# Patient Record
Sex: Male | Born: 1975
Health system: Southern US, Community
[De-identification: ages and names within clinical notes are randomized; demographics above are authoritative.]

---

## 2004-06-16 ENCOUNTER — Emergency Department: Payer: Self-pay | Admitting: Emergency Medicine

## 2004-08-13 ENCOUNTER — Ambulatory Visit: Payer: Self-pay | Admitting: Family Medicine

## 2004-09-15 ENCOUNTER — Ambulatory Visit: Payer: Self-pay | Admitting: Internal Medicine

## 2004-09-24 ENCOUNTER — Ambulatory Visit: Payer: Self-pay | Admitting: Internal Medicine

## 2005-11-03 ENCOUNTER — Emergency Department: Payer: Self-pay | Admitting: Emergency Medicine

## 2005-11-03 DIAGNOSIS — K649 Unspecified hemorrhoids: Secondary | ICD-10-CM | POA: Insufficient documentation

## 2005-11-06 ENCOUNTER — Emergency Department (HOSPITAL_COMMUNITY): Admission: EM | Admit: 2005-11-06 | Discharge: 2005-11-06 | Payer: Self-pay | Admitting: Family Medicine

## 2005-12-09 ENCOUNTER — Ambulatory Visit: Payer: Self-pay | Admitting: Internal Medicine

## 2007-05-01 ENCOUNTER — Ambulatory Visit: Payer: Self-pay | Admitting: Family Medicine

## 2007-05-01 DIAGNOSIS — J020 Streptococcal pharyngitis: Secondary | ICD-10-CM | POA: Insufficient documentation

## 2007-05-01 LAB — CONVERTED CEMR LAB: Rapid Strep: POSITIVE

## 2007-12-28 ENCOUNTER — Ambulatory Visit: Payer: Self-pay | Admitting: Internal Medicine

## 2011-03-03 ENCOUNTER — Emergency Department: Payer: Self-pay | Admitting: Emergency Medicine

## 2013-06-26 ENCOUNTER — Encounter (HOSPITAL_COMMUNITY): Payer: Self-pay | Admitting: Emergency Medicine

## 2013-06-26 ENCOUNTER — Emergency Department (INDEPENDENT_AMBULATORY_CARE_PROVIDER_SITE_OTHER): Payer: 59

## 2013-06-26 ENCOUNTER — Emergency Department (HOSPITAL_COMMUNITY)
Admission: EM | Admit: 2013-06-26 | Discharge: 2013-06-26 | Disposition: A | Discharge status: Home / Self Care | Payer: 59 | Source: Home / Self Care | Attending: Family Medicine | Admitting: Family Medicine

## 2013-06-26 DIAGNOSIS — S8010XA Contusion of unspecified lower leg, initial encounter: Secondary | ICD-10-CM

## 2013-06-26 DIAGNOSIS — W19XXXA Unspecified fall, initial encounter: Secondary | ICD-10-CM

## 2013-06-26 DIAGNOSIS — S8012XA Contusion of left lower leg, initial encounter: Secondary | ICD-10-CM

## 2013-06-26 DIAGNOSIS — Y9379 Activity, other specified sports and athletics: Secondary | ICD-10-CM

## 2013-06-26 MED ORDER — TRAMADOL HCL 50 MG PO TABS: 50.0000 mg | ORAL_TABLET | Freq: Four times a day (QID) | ORAL | Status: DC | PRN

## 2013-06-26 NOTE — ED Notes (Signed)
Pt c/o left leg/knee inj onset last night Reports he was playing kickball and his leg/knee buckled underneath him Pain is throbbing and shoots down to foot Has been taking ibup and applying ice w/no relief Ambulated well to exam room w/slow gait Alert w/no signs of acute distress.

## 2013-06-26 NOTE — Discharge Instructions (Signed)
Thank you for coming in today. Use tramadol for pain as needed.  Come back if worsening.  If dramatically worse go to the ED.  Do not drive after taking tramadol.   Acute Compartment Syndrome Compartment syndrome is a painful condition that occurs when swelling and pressure build up in a body space (compartment) of the arms or legs. Groups of muscles, nerves, and blood vessels in the arms and legs are separated into various compartments. Each compartment is surrounded by tough layers of tissue called fascia. In compartment syndrome, pressure builds up within the layers of fascia and begins to push on the structures within that compartment.  In acute compartment syndrome, the pressure builds up suddenly, often as the result of an injury. This is a surgical emergency. When a muscle in the compartment moves, you may feel severe pain. If pressure continues to increase, it can block the flow of blood in the smallest blood vessels (capillaries). Then, the nerves and muscles in the compartment cannot get enough oxygen and nutrients (substances needed for survival). They will start to die within 4 8 hours. That is why the pressure needs to be relieved immediately. Identifying the condition early and treating it quickly can prevent most problems. CAUSES  Various things can lead to compartment syndrome. Possible causes include:   Injury. Some injuries can cause swelling or bleeding in a compartment. This can lead to compartment syndrome. Injuries that may cause this problem include:  Broken bones, especially the long bones of the arms and legs.  Crushing injuries.  Penetrating injuries, such as a knife wound that punctures the skin and tissue underneath.  Badly bruised muscles.  Poisonous bites, such as a snake bite.  Severe burns.  Blocked blood flow. This could result from:  A cast or bandage that is too tight.  A surgical procedure. Blood flow sometimes has to be stopped for a while during a  surgery, usually with a tourniquet.  Lying for too long in a position that restricts blood flow. This can happen in people who have nerve damage or if a person is unconscious for a long time.  Drugs used to build up muscles (anabolic steroids).  Drugs that keep the blood from forming clots (blood thinners). SIGNS AND SYMPTOMS  The most common symptom of compartment syndrome is pain. The pain may:   Get worse when moving or stretching the affected body part.  Be more severe than it should be for an injury.  Come along with a feeling of tingling or burning.  Become worse when the area is pushed or squeezed.  Be unaffected by pain medicine. Other symptoms include:   A feeling of tightness or fullness in the affected area.   A loss of feeling.  Weakness in the area.  Loss of movement.  Skin becoming pale, tight, and shiny over the painful area.  DIAGNOSIS  Your health care provider may suspect the problem based on how you describe the pain. The diagnosis is made by using a special device that measures the pressure in the affected area. Blood tests, X-rays, or an ultrasound exam may be done to help rule out other problems.  TREATMENT  Compartment syndrome is a surgical emergency. It should be treated very quickly.   First-aid treatment is given first. This may include:  Promptly treating an injury.  Loosening or removing any cast, bandage, or external wrap that may be causing pain.  Raising the painful arm or leg to the same level as the heart.  Giving oxygen.  Giving fluid through an IV access tube that is put into a vein in the hand or arm.  Surgery (fasciotomy) is needed to relieve the pressure and help prevent permanent damage. In this surgery, cuts (incisions) are made through the fascia to relieve the pressure in the compartment. Document Released: 12/29/2008 Document Revised: 09/12/2012 Document Reviewed: 08/14/2012 [facility] Patient Information 2014 [place],  [place].

## 2013-06-26 NOTE — ED Provider Notes (Signed)
Tony Robinson is a 38 y.o. male who presents to Urgent Care today for left leg pain. Patient was playing kickball yesterday evening when he ran and fell onto his left leg. He noted immediate pain in the anterior lateral shin.The pain is worse with ankle dorsiflexion. He denies any significant radiating pain weakness or numbness. The pain has worsened progressively over the last day. The pain is worse with activity and better with rest. He's tried ibuprofen which has been insufficient to control his pain but does help some.   History reviewed. No pertinent past medical history. History  Substance Use Topics  . Smoking status: Current Every Day Smoker -- 0.50 packs/day    Types: Cigarettes  . Smokeless tobacco: Not on file  . Alcohol Use: Yes   ROS as above Medications: No current facility-administered medications for this encounter.   Current Outpatient Prescriptions  Medication Sig Dispense Refill  . traMADol (ULTRAM) 50 MG tablet Take 1 tablet (50 mg total) by mouth every 6 (six) hours as needed.  15 tablet  0    Exam:  BP 136/83  Pulse 72  Temp(Src) 98.1 F (36.7 C) (Oral)  Resp 17  SpO2 100% Gen: Well NAD Left lower leg:   Hip: Nontender full motion Knee: No effusion nontender full motion stable ligamentous exam Lower leg: Normal-appearing no significant swelling ecchymosis. Tender palpation anterior lateral shin. Pain with resisted foot extension. Pulses capillary refill and sensation are intact distally. Antalgic gait.  Limited musculoskeletal ultrasound of the lateral leg: No bulging compartments present. Fascia is normal without bulging. The fascicular pattern is normal throughout. Intact blood flow present on color Doppler assessment. Low probability for compartment syndrome. Normal bony structures. Lateral meniscus is normal-appearing in the knee. The peroneal nerve at the posterior lateral part of the knee is also normal appearing  No results found for this or any  previous visit (from the past 24 hour(s)). Dg Tibia/fibula Left  06/26/2013   CLINICAL DATA:  Leg injury.  Pain.  EXAM: LEFT TIBIA AND FIBULA - 2 VIEW  COMPARISON:  None.  FINDINGS: There is no evidence of fracture or other focal bone lesions. Soft tissues are unremarkable. The knee and ankle joints are located.  IMPRESSION: Negative left tibia and fibula radiographs.   Electronically Signed   By: Gennette Pac M.D.   On: 06/26/2013 18:18    Assessment and Plan: 38 y.o. male with left lateral leg pain. Likely contusion. Given the degree of his pain there is concern for compartment syndrome however he is intact pulses and is able to move his foot normally. The ultrasound does not suggest compartment syndrome. However if his symptoms worsen he is to return to clinic or go to the emergency room for further evaluation. That time would recommend referral to orthopedics.  Discussed warning signs or symptoms. Please see discharge instructions. Patient expresses understanding.    Rodolph Bong, MD 06/26/13 [phone]

## 2013-06-28 ENCOUNTER — Encounter (HOSPITAL_COMMUNITY): Payer: Self-pay | Admitting: Emergency Medicine

## 2013-06-28 ENCOUNTER — Emergency Department (INDEPENDENT_AMBULATORY_CARE_PROVIDER_SITE_OTHER)
Admission: EM | Admit: 2013-06-28 | Discharge: 2013-06-28 | Disposition: A | Discharge status: Home / Self Care | Payer: 59 | Source: Home / Self Care | Attending: Emergency Medicine | Admitting: Emergency Medicine

## 2013-06-28 DIAGNOSIS — M79609 Pain in unspecified limb: Secondary | ICD-10-CM

## 2013-06-28 NOTE — Discharge Instructions (Signed)
Your persistent symptoms are suspicious for a popliteal ligament injury. We have placed you in a knee immobilizer to wear while weight bearing. You may remove at night. No strenuous activity until you have followed up with the orthopedists. Ibuprofen 600-800 mg three times a day will help with pain and inflammation. Please arrange follow up with Dr Magnus Ivan as soon as possible for further evaluation.

## 2013-06-28 NOTE — ED Provider Notes (Signed)
Medical screening examination/treatment/procedure(s) were performed by non-physician practitioner and as supervising physician I was immediately available for consultation/collaboration.  Raychelle Hudman, M.D.  Dafne Nield C Sherrin Stahle, MD 06/28/13 2219 

## 2013-06-28 NOTE — ED Notes (Signed)
Pt  Injured  His    l  Leg     3  Days  Ago  He  Was  Seen  By  Dr  Denyse Amass  Had  X  Rays  And   Sonogram      He  Was  Advised  To  Get  Rechecked  If any  Increase  Or  Change  Is  Symptoms       Pt  Reports  Now  Has  Has  Some   Pain and swelling  Behind the  l  Knee   He  Ambulated  To  Room

## 2013-06-28 NOTE — ED Provider Notes (Signed)
CSN: [id number]     Arrival date & time 06/28/13  1653 History   First MD Initiated Contact with Patient 06/28/13 1712     Chief Complaint  Patient presents with  . Follow-up   Patient is a 38 y.o. male presenting with knee pain. The history is provided by the patient.  Knee Pain Location:  Knee and leg Time since incident:  3 days Injury: yes   Mechanism of injury: fall   Fall:    Fall occurred:  Recreating/playing   Height of fall:  3-4 ft   Impact surface:  Grass   Entrapped after fall: no   Leg location:  L leg Knee location:  L knee Pain details:    Quality:  Burning and shooting   Severity:  Moderate   Onset quality:  Gradual   Duration:  24 hours   Timing:  Constant   Progression:  Worsening Chronicity:  New Dislocation: no   Foreign body present:  No foreign bodies Relieved by:  Rest Worsened by:  Bearing weight and activity Ineffective treatments:  Ice Associated symptoms: swelling   Associated symptoms: no fever, no muscle weakness, no numbness, no stiffness and no tingling   Risk factors: no concern for non-accidental trauma, no frequent fractures, no known bone disorder, no obesity and no recent illness   Pt seen here 06/26/13 s/p fall on 06/25/13 for (L) lateral leg pain. Xrays and bedside u/s at that time neg for fx or findings to suggest compartment syndrome. Since that time the (L) lateral leg pain has persisted and he has developed pain to his (L) posterior knee. He noted the area behind the (L) knee was swollen and it hurts worse w/ extended weight bearing though walking on does not cause significant increased pain. The pain is sometimes sharp and sometimes burning in nature and at times radiates down the (L) lateral LE.   History reviewed. No pertinent past medical history. History reviewed. No pertinent past surgical history. History reviewed. No pertinent family history. History  Substance Use Topics  . Smoking status: Current Every Day Smoker -- 0.50  packs/day    Types: Cigarettes  . Smokeless tobacco: Not on file  . Alcohol Use: Yes    Review of Systems  Constitutional: Negative for fever.  Musculoskeletal: Negative for stiffness.  All other systems reviewed and are negative.   Allergies  Review of patient's allergies indicates no known allergies.  Home Medications   Prior to Admission medications   Medication Sig Start Date End Date Taking? Authorizing Provider  traMADol (ULTRAM) 50 MG tablet Take 1 tablet (50 mg total) by mouth every 6 (six) hours as needed. 06/26/13   Rodolph Bong, MD   BP 113/84  Pulse 99  Temp(Src) 98.3 F (36.8 C) (Oral)  Resp 16  SpO2 99% Physical Exam  Constitutional: He is oriented to person, place, and time. He appears well-developed and well-nourished.  HENT:  Head: Normocephalic and atraumatic.  Eyes: Conjunctivae are normal.  Cardiovascular: Normal rate.   Pulmonary/Chest: Effort normal.  Musculoskeletal:  Swelling noted to the popliteal fossa of (L) leg. No contusion or ecchymosis. Significant pain w/ internal rotation of LLE w/ (L) knee flexed while pt prone.  Neurological: He is alert and oriented to person, place, and time.  Skin: Skin is warm and dry.  Psychiatric: He has a normal mood and affect.    ED Course  Procedures (including critical care time) Labs Review Labs Reviewed - No data to display  Imaging Review No results found.   MDM   1. Popliteal pain    Persistent (L) lateral leg pain w/ gradual worsening of posterior (L) knee pain. PE suspicious for popliteus ligament vs posterolateral corner injury. Pt placed in (L) knee immobilizer and referred to orthopedists. Encouraged TID Ibuprofen over next 3-5 days.  Pt agreeable w/ plan.    Leanne ChangKatherine P Schorr, NP 06/28/13 1904

## 2014-10-13 ENCOUNTER — Encounter (HOSPITAL_COMMUNITY): Payer: Self-pay | Admitting: Emergency Medicine

## 2014-10-13 ENCOUNTER — Emergency Department (INDEPENDENT_AMBULATORY_CARE_PROVIDER_SITE_OTHER)
Admission: EM | Admit: 2014-10-13 | Discharge: 2014-10-13 | Disposition: A | Discharge status: Home / Self Care | Payer: 59 | Source: Home / Self Care | Attending: Family Medicine | Admitting: Family Medicine

## 2014-10-13 DIAGNOSIS — M542 Cervicalgia: Secondary | ICD-10-CM | POA: Diagnosis not present

## 2014-10-13 DIAGNOSIS — M62838 Other muscle spasm: Secondary | ICD-10-CM

## 2014-10-13 DIAGNOSIS — M6248 Contracture of muscle, other site: Secondary | ICD-10-CM | POA: Diagnosis not present

## 2014-10-13 MED ORDER — METHOCARBAMOL 500 MG PO TABS
500.0000 mg | ORAL_TABLET | Freq: Four times a day (QID) | ORAL | Status: DC | PRN
Start: 2014-10-13 — End: 2016-02-22

## 2014-10-13 NOTE — ED Provider Notes (Signed)
CSN: [id number]     Arrival date & time 10/13/14  1717 History   First MD Initiated Contact with Patient 10/13/14 1820     Chief Complaint  Patient presents with  . Neck Pain   (Consider location/radiation/quality/duration/timing/severity/associated sxs/prior Treatment) HPI   Back pain, bilateral, started 2 weeks ago shortly after patient woke up in the morning. Radiation down to upper back area. Denies any numbness or tingling or weakness. Occasional headache and nausea. Symptoms are intermittent but getting worse. Ibuprofen 400 mg without relief. Denies any fevers, chest pain, short of breath, cough, rash. Denies history of trauma to the neck  History reviewed. No pertinent past medical history. History reviewed. No pertinent past surgical history. History reviewed. No pertinent family history. Social History  Substance Use Topics  . Smoking status: Current Every Day Smoker -- 0.50 packs/day    Types: Cigarettes  . Smokeless tobacco: None  . Alcohol Use: Yes    Review of Systems Per HPI with all other pertinent systems negative.   Allergies  Review of patient's allergies indicates no known allergies.  Home Medications   Prior to Admission medications   Medication Sig Start Date End Date Taking? Authorizing Provider  methocarbamol (ROBAXIN) 500 MG tablet Take 1-2 tablets (500-1,000 mg total) by mouth every 6 (six) hours as needed for muscle spasms. 10/13/14   Ozella Rocks, MD  traMADol (ULTRAM) 50 MG tablet Take 1 tablet (50 mg total) by mouth every 6 (six) hours as needed. 06/26/13   Rodolph Bong, MD   Meds Ordered and Administered this Visit  Medications - No data to display  BP 138/94 mmHg  Pulse 69  Temp(Src) 98 F (36.7 C) (Oral)  Resp 18  SpO2 98% No data found.   Physical Exam Physical Exam  Constitutional: oriented to person, place, and time. appears well-developed and well-nourished. No distress.  HENT:  Head: Normocephalic and atraumatic.  Eyes: EOMI.  PERRL.  Neck: Normal range of motion.  Cardiovascular: RRR, no m/r/g, 2+ distal pulses,  Pulmonary/Chest: Effort normal and breath sounds normal. No respiratory distress.  Abdominal: Soft. Bowel sounds are normal. NonTTP, no distension.  Musculoskeletal: Neck with full range of motion and freely movable, mild. Vital cervical muscle tenderness and bilateral trapezius muscle tightness to palpation.  Neurological: alert and oriented to person, place, and time.  Skin: Skin is warm. No rash noted. non diaphoretic.  Psychiatric: normal mood and affect. behavior is normal. Judgment and thought content normal.   ED Course  Procedures (including critical care time)  Labs Review Labs Reviewed - No data to display  Imaging Review No results found.   Visual Acuity Review  Right Eye Distance:   Left Eye Distance:   Bilateral Distance:    Right Eye Near:   Left Eye Near:    Bilateral Near:         MDM   1. Neck pain   2. Trapezius muscle spasm    Increase ibuprofen to 600 800 mg every 6-8 hours, start Robaxin, describe specific stretching exercises, heat, and massage the patient is to start more regularly to treat symptoms. Patient follow-up with orthopedic surgeon [facility] if not improving.    Ozella Rocks, MD 10/13/14 [phone]

## 2014-10-13 NOTE — Discharge Instructions (Signed)
Your neck pain is coming from muscle strain and spasm of the spinal neck muscles and the trapezius muscle. Please use the muscle relaxer as described in your visit. Please increase your dose of ibuprofen to 600 mg to 800 mg every 6-8 hours. Please report to apply heat, massage, and stretch the area multiple times per day. These call either the [facility] on [address] one of the local orthopedic offices for a follow-up appointment for when you come back for your business trip. If your symptoms have resolved by then E appointment otherwise follow-up in their office for further management.

## 2014-10-13 NOTE — ED Notes (Signed)
C/o neck pain States upper back to neck radiating to right shoulder Heat, cool and stretching used as tx

## 2015-04-23 ENCOUNTER — Emergency Department (HOSPITAL_COMMUNITY): Payer: 59

## 2015-04-23 ENCOUNTER — Observation Stay (HOSPITAL_COMMUNITY)
Admission: EM | Admit: 2015-04-23 | Discharge: 2015-04-25 | Disposition: A | Discharge status: Home / Self Care | Payer: 59 | Attending: Internal Medicine | Admitting: Internal Medicine

## 2015-04-23 ENCOUNTER — Encounter (HOSPITAL_COMMUNITY): Payer: Self-pay | Admitting: *Deleted

## 2015-04-23 DIAGNOSIS — K529 Noninfective gastroenteritis and colitis, unspecified: Principal | ICD-10-CM | POA: Insufficient documentation

## 2015-04-23 DIAGNOSIS — F17210 Nicotine dependence, cigarettes, uncomplicated: Secondary | ICD-10-CM | POA: Diagnosis not present

## 2015-04-23 LAB — URINALYSIS, ROUTINE W REFLEX MICROSCOPIC
Bilirubin Urine: NEGATIVE
GLUCOSE, UA: NEGATIVE mg/dL
HGB URINE DIPSTICK: NEGATIVE
Ketones, ur: NEGATIVE mg/dL
LEUKOCYTES UA: NEGATIVE
Nitrite: NEGATIVE
PH: 7 (ref 5.0–8.0)
Protein, ur: NEGATIVE mg/dL
Specific Gravity, Urine: 1.018 (ref 1.005–1.030)

## 2015-04-23 LAB — CBC WITH DIFFERENTIAL/PLATELET
BASOS ABS: 0 10*3/uL (ref 0.0–0.1)
Basophils Relative: 0 %
EOS PCT: 0 %
Eosinophils Absolute: 0 10*3/uL (ref 0.0–0.7)
HEMATOCRIT: 44.7 % (ref 39.0–52.0)
Hemoglobin: 15.2 g/dL (ref 13.0–17.0)
LYMPHS ABS: 1.5 10*3/uL (ref 0.7–4.0)
LYMPHS PCT: 17 %
MCH: 30.6 pg (ref 26.0–34.0)
MCHC: 34 g/dL (ref 30.0–36.0)
MCV: 89.9 fL (ref 78.0–100.0)
MONO ABS: 0.9 10*3/uL (ref 0.1–1.0)
Monocytes Relative: 11 %
NEUTROS ABS: 6 10*3/uL (ref 1.7–7.7)
Neutrophils Relative %: 72 %
PLATELETS: 176 10*3/uL (ref 150–400)
RBC: 4.97 MIL/uL (ref 4.22–5.81)
RDW: 13.2 % (ref 11.5–15.5)
WBC: 8.4 10*3/uL (ref 4.0–10.5)

## 2015-04-23 LAB — COMPREHENSIVE METABOLIC PANEL
ALK PHOS: 57 U/L (ref 38–126)
ALT: 26 U/L (ref 17–63)
AST: 24 U/L (ref 15–41)
Albumin: 4 g/dL (ref 3.5–5.0)
Anion gap: 12 (ref 5–15)
BILIRUBIN TOTAL: 0.5 mg/dL (ref 0.3–1.2)
BUN: 9 mg/dL (ref 6–20)
CHLORIDE: 101 mmol/L (ref 101–111)
CO2: 22 mmol/L (ref 22–32)
Calcium: 9.5 mg/dL (ref 8.9–10.3)
Creatinine, Ser: 1.17 mg/dL (ref 0.61–1.24)
GLUCOSE: 153 mg/dL — AB (ref 65–99)
POTASSIUM: 3.8 mmol/L (ref 3.5–5.1)
Sodium: 135 mmol/L (ref 135–145)
TOTAL PROTEIN: 6.8 g/dL (ref 6.5–8.1)

## 2015-04-23 LAB — LIPASE, BLOOD: Lipase: 24 U/L (ref 11–51)

## 2015-04-23 LAB — I-STAT TROPONIN, ED: Troponin i, poc: 0 ng/mL (ref 0.00–0.08)

## 2015-04-23 LAB — I-STAT CG4 LACTIC ACID, ED: LACTIC ACID, VENOUS: 0.85 mmol/L (ref 0.5–2.0)

## 2015-04-23 MED ORDER — IOHEXOL 350 MG/ML SOLN
100.0000 mL | Freq: Once | INTRAVENOUS | Status: AC | PRN
  Administered 2015-04-23: 100 mL via INTRAVENOUS

## 2015-04-23 MED ORDER — OXYCODONE-ACETAMINOPHEN 5-325 MG PO TABS
ORAL_TABLET | ORAL | Status: AC
  Filled 2015-04-23: qty 1

## 2015-04-23 MED ORDER — MORPHINE SULFATE (PF) 4 MG/ML IV SOLN
4.0000 mg | Freq: Once | INTRAVENOUS | Status: AC
  Administered 2015-04-23: 4 mg via INTRAVENOUS
  Filled 2015-04-23: qty 1

## 2015-04-23 MED ORDER — SODIUM CHLORIDE 0.9 % IV BOLUS (SEPSIS)
1000.0000 mL | Freq: Once | INTRAVENOUS | Status: AC
  Administered 2015-04-23: 1000 mL via INTRAVENOUS

## 2015-04-23 MED ORDER — OXYCODONE-ACETAMINOPHEN 5-325 MG PO TABS
1.0000 | ORAL_TABLET | ORAL | Status: DC | PRN
  Administered 2015-04-23: 1 via ORAL

## 2015-04-23 MED ORDER — GI COCKTAIL ~~LOC~~: 30.0000 mL | Freq: Once | ORAL | Status: DC

## 2015-04-23 NOTE — ED Notes (Signed)
Pt here with epigastric pain that feels sharp and is stabbing.  Pt denies any n/v with this.  Pt appears in obvious discomfort.

## 2015-04-23 NOTE — ED Provider Notes (Signed)
CSN: [id number]     Arrival date & time 04/23/15  1927 History   First MD Initiated Contact with Patient 04/23/15 2044     No chief complaint on file.     HPI  40 y.o. previously healthy male presenting with epigastric abdominal pain that began this morning and has been getting more severe throughout the day. Pain is mild at baseline, but intermittently becomes very severe in a spasm-like fashion. When it is most severe it is 10 out of 10 and causes the patient to double over and be unable to speak. He has not eaten throughout the day today secondary to the pain. He denies nausea and vomiting. States that he is hungry but afraid to eat. Has been having normal bowel movements with no constipation, diarrhea, blood in his stools. No frequency or dysuria. No hematuria. Denies back pain. He drinks 2 alcoholic beverages a night. No history of kidney stones. Patient began taking Tamiflu yesterday for temperature of 99.9 and generalized body aches. Denies sore throat, cough, chest pain.   History reviewed. No pertinent past medical history. History reviewed. No pertinent past surgical history. No family history on file. Social History  Substance Use Topics  . Smoking status: Current Every Day Smoker -- 0.50 packs/day    Types: Cigarettes  . Smokeless tobacco: None  . Alcohol Use: Yes    Review of Systems  Constitutional: Negative for fever, chills, activity change and appetite change.  HENT: Negative for congestion, facial swelling, rhinorrhea and sore throat.   Eyes: Negative for visual disturbance.  Respiratory: Negative for cough, shortness of breath and wheezing.   Cardiovascular: Negative for chest pain, palpitations and leg swelling.  Gastrointestinal: Positive for abdominal pain. Negative for nausea, vomiting, diarrhea, constipation, blood in stool and abdominal distention.  Genitourinary: Negative for dysuria, frequency, hematuria, flank pain and difficulty urinating.  Musculoskeletal:  Negative for myalgias, back pain, joint swelling, arthralgias, neck pain and neck stiffness.  Skin: Negative for rash.  Neurological: Negative for dizziness, weakness, light-headedness and headaches.  Psychiatric/Behavioral: Negative for behavioral problems, confusion and agitation.      Allergies  Review of patient's allergies indicates no known allergies.  Home Medications   Prior to Admission medications   Medication Sig Start Date End Date Taking? Authorizing Provider  methocarbamol (ROBAXIN) 500 MG tablet Take 1-2 tablets (500-1,000 mg total) by mouth every 6 (six) hours as needed for muscle spasms. 10/13/14   Ozella Rocksavid J Merrell, MD  traMADol (ULTRAM) 50 MG tablet Take 1 tablet (50 mg total) by mouth every 6 (six) hours as needed. 06/26/13   Rodolph BongEvan S Corey, MD   BP 106/76 mmHg  Pulse 65  Temp(Src) 98.3 F (36.8 C) (Oral)  Resp 20  Ht 6\' 2"  (1.88 m)  Wt 79.379 kg  BMI 22.46 kg/m2  SpO2 96% Physical Exam  Constitutional: He is oriented to person, place, and time. He appears well-developed and well-nourished. No distress.  HENT:  Head: Normocephalic and atraumatic.  Right Ear: External ear normal.  Left Ear: External ear normal.  Nose: Nose normal.  Mouth/Throat: Oropharynx is clear and moist. No oropharyngeal exudate.  Eyes: Conjunctivae are normal. Pupils are equal, round, and reactive to light. Right eye exhibits no discharge. Left eye exhibits no discharge. No scleral icterus.  Neck: Normal range of motion. Neck supple. No tracheal deviation present.  Cardiovascular: Normal rate, regular rhythm, normal heart sounds and intact distal pulses.  Exam reveals no gallop and no friction rub.   No murmur  heard. Pulmonary/Chest: Effort normal and breath sounds normal. No respiratory distress. He has no wheezes. He has no rales.  Abdominal: Soft. Bowel sounds are normal. He exhibits no distension and no mass. There is tenderness (epigastric). There is no rebound and no guarding.   Musculoskeletal: Normal range of motion. He exhibits no edema or tenderness.  Neurological: He is alert and oriented to person, place, and time. He exhibits normal muscle tone.  Skin: Skin is warm and dry. No rash noted. He is not diaphoretic.  Psychiatric: He has a normal mood and affect. His behavior is normal. Judgment and thought content normal.    ED Course  Procedures (including critical care time) Labs Review Labs Reviewed  COMPREHENSIVE METABOLIC PANEL - Abnormal; Notable for the following:    Glucose, Bld 153 (*)    All other components within normal limits  I-STAT CG4 LACTIC ACID, ED - Abnormal; Notable for the following:    Lactic Acid, Venous 0.34 (*)    All other components within normal limits  CBC WITH DIFFERENTIAL/PLATELET  URINALYSIS, ROUTINE W REFLEX MICROSCOPIC (NOT AT [facility])  LIPASE, BLOOD  I-STAT TROPOININ, ED  I-STAT CG4 LACTIC ACID, ED    Imaging Review Dg Abd 2 Views  04/23/2015  CLINICAL DATA:  Severe abdominal pain beginning today. EXAM: ABDOMEN - 2 VIEW COMPARISON:  None. FINDINGS: The bowel gas pattern is normal. There is no evidence of free air. No radio-opaque calculi or other significant radiographic abnormality is seen. IMPRESSION: Negative. Electronically Signed   By: Amie Portland M.D.   On: 04/23/2015 20:57   Ct Cta Abd/pel W/cm &/or W/o Cm  04/23/2015  CLINICAL DATA:  Acute onset of generalized abdominal pain. Initial encounter. EXAM: CTA ABDOMEN AND PELVIS wITHOUT AND WITH CONTRAST TECHNIQUE: Multidetector CT imaging of the abdomen and pelvis was performed using the standard protocol during bolus administration of intravenous contrast. Multiplanar reconstructed images and MIPs were obtained and reviewed to evaluate the vascular anatomy. CONTRAST:  OMNIPAQUE IOHEXOL 350 MG/ML SOLN COMPARISON:  Abdominal radiograph performed earlier today at 8:34 p.m. FINDINGS: The visualized lung bases are clear. The liver and spleen are unremarkable in  appearance. The gallbladder is within normal limits. The pancreas and adrenal glands are unremarkable. The kidneys are unremarkable in appearance. There is no evidence of hydronephrosis. No renal or ureteral stones are seen. No perinephric stranding is appreciated. There is diffuse wall thickening and soft tissue inflammation involving a very long segment of distal ileum, extending to the level of the cecum, with a small amount of associated free fluid. There is no evidence for mesenteric ischemia. The visualized vasculature appears intact. This may reflect infectious or inflammatory ileitis. Crohn's disease cannot be excluded, though the appearance is somewhat less typical for Crohn's disease. The more proximal small bowel is unremarkable. The stomach is within normal limits. No acute vascular abnormalities are seen. The abdominal aorta is fully patent. The celiac trunk, superior mesenteric artery, bilateral renal arteries and inferior mesenteric artery are fully patent. No calcific atherosclerotic disease is seen. The appendix is not well characterized; there is no evidence of appendicitis. Fluid and stool are noted within the colon. The colon is grossly unremarkable in appearance. The bladder is mildly distended and grossly unremarkable. The prostate remains normal in size. No inguinal lymphadenopathy is seen. No acute osseous abnormalities are identified. Review of the MIP images confirms the above findings. IMPRESSION: 1. Diffuse wall thickening and soft tissue inflammation involving a very long segment of distal ileum, extending  to the level of the cecum, with a small amount of associated free fluid. No evidence for mesenteric ischemia. This may reflect infectious or inflammatory ileitis. Crohn's disease cannot be excluded, though the appearance is somewhat less typical for Crohn's disease. 2. Visualized vasculature is fully patent and unremarkable in appearance. No evidence of calcific atherosclerotic  disease. Electronically Signed   By: Roanna Raider M.D.   On: 04/23/2015 22:43   I have personally reviewed and evaluated these images and lab results as part of my medical decision-making.   EKG Interpretation None      MDM   Final diagnoses:  Ileitis   Patient intermittently appears to be in severe pain. He is afebrile with stable vital signs. Abdomen is moderately tender to palpation in the epigastrium. No leukocytosis and labs are unremarkable. Urinalysis is not indicative of UTI. Lipase is not elevated and I doubt pancreatitis. No lactic acidosis. Do not suspect sepsis. CT abdomen performed that reveals diffuse wall thickening of a very long segment of the distal ileum extending to the cecum. No evidence of mesenteric ischemia. Will give Cipro and Flagyl for empiric treatment of likely infectious ileitis. Given patient's continued severe pain, will admit to the hospital for pain relief and observation. If symptoms do not improve with antibiotics patient will require further workup for inflammatory bowel disease. He denies history of same. He stable for admission.     Jenifer Ernestina Penna, MD 04/24/15 8119  Margarita Grizzle, MD 04/27/15 1721

## 2015-04-23 NOTE — ED Notes (Signed)
Pt is having sever spasms where pain is 10/10

## 2015-04-23 NOTE — ED Notes (Signed)
Adjusted acuity due to pt severe pain

## 2015-04-24 ENCOUNTER — Encounter (HOSPITAL_COMMUNITY): Payer: Self-pay | Admitting: Internal Medicine

## 2015-04-24 DIAGNOSIS — K529 Noninfective gastroenteritis and colitis, unspecified: Secondary | ICD-10-CM | POA: Diagnosis not present

## 2015-04-24 LAB — COMPREHENSIVE METABOLIC PANEL
ALBUMIN: 3.1 g/dL — AB (ref 3.5–5.0)
ALT: 20 U/L (ref 17–63)
AST: 16 U/L (ref 15–41)
Alkaline Phosphatase: 45 U/L (ref 38–126)
Anion gap: 7 (ref 5–15)
BUN: 7 mg/dL (ref 6–20)
CHLORIDE: 107 mmol/L (ref 101–111)
CO2: 25 mmol/L (ref 22–32)
CREATININE: 0.96 mg/dL (ref 0.61–1.24)
Calcium: 8.5 mg/dL — ABNORMAL LOW (ref 8.9–10.3)
GFR calc Af Amer: >60 mL/min (ref >60)
GFR calc non Af Amer: >60 mL/min (ref >60)
GLUCOSE: 94 mg/dL (ref 65–99)
POTASSIUM: 3.7 mmol/L (ref 3.5–5.1)
SODIUM: 139 mmol/L (ref 135–145)
Total Bilirubin: 0.6 mg/dL (ref 0.3–1.2)
Total Protein: 5.4 g/dL — ABNORMAL LOW (ref 6.5–8.1)

## 2015-04-24 LAB — CBC
HCT: 39.7 % (ref 39.0–52.0)
Hemoglobin: 13.5 g/dL (ref 13.0–17.0)
MCH: 30.9 pg (ref 26.0–34.0)
MCHC: 34 g/dL (ref 30.0–36.0)
MCV: 90.8 fL (ref 78.0–100.0)
PLATELETS: 155 10*3/uL (ref 150–400)
RBC: 4.37 MIL/uL (ref 4.22–5.81)
RDW: 13.5 % (ref 11.5–15.5)
WBC: 6 10*3/uL (ref 4.0–10.5)

## 2015-04-24 LAB — GLUCOSE, CAPILLARY
GLUCOSE-CAPILLARY: 86 mg/dL (ref 65–99)
GLUCOSE-CAPILLARY: 128 mg/dL — AB (ref 65–99)
Glucose-Capillary: 98 mg/dL (ref 65–99)
Glucose-Capillary: 90 mg/dL (ref 65–99)
Glucose-Capillary: 97 mg/dL (ref 65–99)

## 2015-04-24 LAB — I-STAT CG4 LACTIC ACID, ED: Lactic Acid, Venous: 0.34 mmol/L — ABNORMAL LOW (ref 0.5–2.0)

## 2015-04-24 MED ORDER — PANTOPRAZOLE SODIUM 40 MG PO TBEC
40.0000 mg | DELAYED_RELEASE_TABLET | Freq: Every day | ORAL | Status: DC
  Administered 2015-04-24: 40 mg via ORAL
  Filled 2015-04-24: qty 1

## 2015-04-24 MED ORDER — CIPROFLOXACIN IN D5W 400 MG/200ML IV SOLN
400.0000 mg | Freq: Two times a day (BID) | INTRAVENOUS | Status: DC
  Administered 2015-04-24 – 2015-04-25 (×3): 400 mg via INTRAVENOUS
  Filled 2015-04-24 (×4): qty 200

## 2015-04-24 MED ORDER — FOLIC ACID 1 MG PO TABS
1.0000 mg | ORAL_TABLET | Freq: Every day | ORAL | Status: DC
  Administered 2015-04-24 – 2015-04-25 (×2): 1 mg via ORAL
  Filled 2015-04-24 (×2): qty 1

## 2015-04-24 MED ORDER — ONDANSETRON HCL 4 MG/2ML IJ SOLN: 4.0000 mg | Freq: Four times a day (QID) | INTRAMUSCULAR | Status: DC | PRN

## 2015-04-24 MED ORDER — CIPROFLOXACIN IN D5W 400 MG/200ML IV SOLN: 400.0000 mg | Freq: Once | INTRAVENOUS | Status: DC

## 2015-04-24 MED ORDER — METRONIDAZOLE IN NACL 5-0.79 MG/ML-% IV SOLN
500.0000 mg | Freq: Once | INTRAVENOUS | Status: AC
  Administered 2015-04-24: 500 mg via INTRAVENOUS
  Filled 2015-04-24: qty 100

## 2015-04-24 MED ORDER — VITAMIN B-1 100 MG PO TABS
100.0000 mg | ORAL_TABLET | Freq: Every day | ORAL | Status: DC
  Administered 2015-04-24 – 2015-04-25 (×2): 100 mg via ORAL
  Filled 2015-04-24 (×2): qty 1

## 2015-04-24 MED ORDER — INFLUENZA VAC SPLIT QUAD 0.5 ML IM SUSY: 0.5000 mL | PREFILLED_SYRINGE | INTRAMUSCULAR | Status: DC | PRN

## 2015-04-24 MED ORDER — LORAZEPAM 1 MG PO TABS: 1.0000 mg | ORAL_TABLET | Freq: Four times a day (QID) | ORAL | Status: DC | PRN

## 2015-04-24 MED ORDER — ACETAMINOPHEN 325 MG PO TABS
650.0000 mg | ORAL_TABLET | Freq: Four times a day (QID) | ORAL | Status: DC | PRN
  Filled 2015-04-24: qty 2

## 2015-04-24 MED ORDER — LORAZEPAM 2 MG/ML IJ SOLN: 1.0000 mg | Freq: Four times a day (QID) | INTRAMUSCULAR | Status: DC | PRN

## 2015-04-24 MED ORDER — SODIUM CHLORIDE 0.9 % IV BOLUS (SEPSIS)
1000.0000 mL | Freq: Once | INTRAVENOUS | Status: AC
  Administered 2015-04-24: 1000 mL via INTRAVENOUS

## 2015-04-24 MED ORDER — PNEUMOCOCCAL VAC POLYVALENT 25 MCG/0.5ML IJ INJ
0.5000 mL | INJECTION | INTRAMUSCULAR | Status: DC | PRN
Start: 2015-04-24 — End: 2015-04-25

## 2015-04-24 MED ORDER — SODIUM CHLORIDE 0.9 % IV SOLN
INTRAVENOUS | Status: AC
  Administered 2015-04-24: 22:00:00 via INTRAVENOUS

## 2015-04-24 MED ORDER — PANTOPRAZOLE SODIUM 40 MG PO TBEC
40.0000 mg | DELAYED_RELEASE_TABLET | Freq: Two times a day (BID) | ORAL | Status: DC
  Administered 2015-04-24 – 2015-04-25 (×2): 40 mg via ORAL
  Filled 2015-04-24 (×2): qty 1

## 2015-04-24 MED ORDER — SODIUM CHLORIDE 0.9 % IV SOLN: INTRAVENOUS | Status: DC

## 2015-04-24 MED ORDER — ADULT MULTIVITAMIN W/MINERALS CH
1.0000 | ORAL_TABLET | Freq: Every day | ORAL | Status: DC
  Administered 2015-04-24 – 2015-04-25 (×2): 1 via ORAL
  Filled 2015-04-24 (×2): qty 1

## 2015-04-24 MED ORDER — OXYCODONE HCL 5 MG PO TABS
5.0000 mg | ORAL_TABLET | Freq: Four times a day (QID) | ORAL | Status: DC | PRN
  Administered 2015-04-24 – 2015-04-25 (×2): 5 mg via ORAL
  Filled 2015-04-24 (×2): qty 1

## 2015-04-24 MED ORDER — ONDANSETRON HCL 4 MG PO TABS: 4.0000 mg | ORAL_TABLET | Freq: Four times a day (QID) | ORAL | Status: DC | PRN

## 2015-04-24 MED ORDER — ACETAMINOPHEN 650 MG RE SUPP: 650.0000 mg | Freq: Four times a day (QID) | RECTAL | Status: DC | PRN

## 2015-04-24 MED ORDER — GI COCKTAIL ~~LOC~~
30.0000 mL | Freq: Once | ORAL | Status: AC
  Administered 2015-04-24: 30 mL via ORAL
  Filled 2015-04-24: qty 30

## 2015-04-24 MED ORDER — MORPHINE SULFATE (PF) 2 MG/ML IV SOLN
1.0000 mg | INTRAVENOUS | Status: DC | PRN
  Administered 2015-04-24: 1 mg via INTRAVENOUS
  Filled 2015-04-24: qty 1

## 2015-04-24 MED ORDER — THIAMINE HCL 100 MG/ML IJ SOLN: 100.0000 mg | Freq: Every day | INTRAMUSCULAR | Status: DC

## 2015-04-24 MED ORDER — METRONIDAZOLE IN NACL 5-0.79 MG/ML-% IV SOLN
500.0000 mg | Freq: Three times a day (TID) | INTRAVENOUS | Status: DC
  Administered 2015-04-24 – 2015-04-25 (×5): 500 mg via INTRAVENOUS
  Filled 2015-04-24 (×8): qty 100

## 2015-04-24 NOTE — Consult Note (Signed)
Referring Provider: Dr. Vanessa Barbara Primary Care Physician:  No primary care provider on file. Primary Gastroenterologist:  Gentry Fitz  Reason for Consultation:  Abdominal pain  HPI: Tony Robinson is a 40 y.o. male with no past medical history had the onset of abdominal pain starting last Wednesday that was preceded by chills and body aches. He then began having epigastric pain that would occur every 3-5 minutes and last for 10-30 seconds. Pain was "stabbing" and was 9-10/10 in intensity when he came into the ER yesterday. Pain is continuing to come on but much less intense and is dull lasting 10-15 seconds. Pain does not radiate. Denies associated N/V/D. Denies melena or hematochezia. Denies weight loss. Moving around the room does make today's recurrent abdominal pain worse. Denies dysphagia, heartburn. Denies any change in his bowel habits. Moves bowels at home 1-2 times per day. Occasional alcohol. Rare NSAIDs about once every 2 weeks. CT scan showed a long segment of inflammation and wall thickening in the distal ileum. Tolerating full liquids. Wife at bedside.  History reviewed. No pertinent past medical history.  History reviewed. No pertinent past surgical history.  Prior to Admission medications   Medication Sig Start Date End Date Taking? Authorizing Provider  methocarbamol (ROBAXIN) 500 MG tablet Take 1-2 tablets (500-1,000 mg total) by mouth every 6 (six) hours as needed for muscle spasms. 10/13/14   Ozella Rocks, MD  traMADol (ULTRAM) 50 MG tablet Take 1 tablet (50 mg total) by mouth every 6 (six) hours as needed. 06/26/13   Rodolph Bong, MD    Scheduled Meds: . ciprofloxacin  400 mg Intravenous Q12H  . folic acid  1 mg Oral Daily  . metronidazole  500 mg Intravenous Q8H  . multivitamin with minerals  1 tablet Oral Daily  . pantoprazole  40 mg Oral BID  . thiamine  100 mg Oral Daily   Continuous Infusions: . sodium chloride     PRN Meds:.acetaminophen **OR** acetaminophen,  Influenza vac split quadrivalent PF, LORazepam **OR** LORazepam, ondansetron **OR** ondansetron (ZOFRAN) IV, pneumococcal 23 valent vaccine  Allergies as of 04/23/2015  . (No Known Allergies)    Family History  Problem Relation Age of Onset  . Diabetes Mellitus II Mother     Social History   Social History  . Marital Status: Married    Spouse Name: N/A  . Number of Children: N/A  . Years of Education: N/A   Occupational History  . Not on file.   Social History Main Topics  . Smoking status: Current Every Day Smoker -- 0.50 packs/day    Types: Cigarettes  . Smokeless tobacco: Not on file  . Alcohol Use: Yes  . Drug Use: No  . Sexual Activity: Not on file   Other Topics Concern  . Not on file   Social History Narrative    Review of Systems: All negative except as stated above in HPI.  Physical Exam: Vital signs: Filed Vitals:   04/24/15 0131 04/24/15 0552  BP: 104/65 106/65  Pulse: 61 72  Temp: 98.4 F (36.9 C) 98.5 F (36.9 C)  Resp: 18 18   Last BM Date: 04/23/15 General:   Alert,  Thin, pleasant and cooperative in NAD Head: atraumatic Eyes: anicteric sclera ENT: oropharynx clear Neck: supple, nontender Lungs:  Clear throughout to auscultation.   No wheezes, crackles, or rhonchi. No acute distress. Heart:  Regular rate and rhythm; no murmurs, clicks, rubs,  or gallops. Abdomen: diffusely tender with guarding (greatest in epigastric and  RLQ), soft, nondistended, +BS  Rectal:  Deferred Ext: no edema Skin: no rash, no jaundice  GI:  Lab Results:  Recent Labs  04/23/15 2041 04/24/15 0531  WBC 8.4 6.0  HGB 15.2 13.5  HCT 44.7 39.7  PLT 176 155   BMET  Recent Labs  04/23/15 2041 04/24/15 0531  NA 135 139  K 3.8 3.7  CL 101 107  CO2 22 25  GLUCOSE 153* 94  BUN 9 7  CREATININE 1.17 0.96  CALCIUM 9.5 8.5*   LFT  Recent Labs  04/24/15 0531  PROT 5.4*  ALBUMIN 3.1*  AST 16  ALT 20  ALKPHOS 45  BILITOT 0.6   PT/INR No results  for input(s): LABPROT, INR in the last 72 hours.   Studies/Results: Dg Abd 2 Views  04/23/2015  CLINICAL DATA:  Severe abdominal pain beginning today. EXAM: ABDOMEN - 2 VIEW COMPARISON:  None. FINDINGS: The bowel gas pattern is normal. There is no evidence of free air. No radio-opaque calculi or other significant radiographic abnormality is seen. IMPRESSION: Negative. Electronically Signed   By: Amie Portlandavid  Ormond M.D.   On: 04/23/2015 20:57   Ct Cta Abd/pel W/cm &/or W/o Cm  04/23/2015  CLINICAL DATA:  Acute onset of generalized abdominal pain. Initial encounter. EXAM: CTA ABDOMEN AND PELVIS wITHOUT AND WITH CONTRAST TECHNIQUE: Multidetector CT imaging of the abdomen and pelvis was performed using the standard protocol during bolus administration of intravenous contrast. Multiplanar reconstructed images and MIPs were obtained and reviewed to evaluate the vascular anatomy. CONTRAST:  100mL OMNIPAQUE IOHEXOL 350 MG/ML SOLN COMPARISON:  Abdominal radiograph performed earlier today at 8:34 p.m. FINDINGS: The visualized lung bases are clear. The liver and spleen are unremarkable in appearance. The gallbladder is within normal limits. The pancreas and adrenal glands are unremarkable. The kidneys are unremarkable in appearance. There is no evidence of hydronephrosis. No renal or ureteral stones are seen. No perinephric stranding is appreciated. There is diffuse wall thickening and soft tissue inflammation involving a very long segment of distal ileum, extending to the level of the cecum, with a small amount of associated free fluid. There is no evidence for mesenteric ischemia. The visualized vasculature appears intact. This may reflect infectious or inflammatory ileitis. Crohn's disease cannot be excluded, though the appearance is somewhat less typical for Crohn's disease. The more proximal small bowel is unremarkable. The stomach is within normal limits. No acute vascular abnormalities are seen. The abdominal aorta  is fully patent. The celiac trunk, superior mesenteric artery, bilateral renal arteries and inferior mesenteric artery are fully patent. No calcific atherosclerotic disease is seen. The appendix is not well characterized; there is no evidence of appendicitis. Fluid and stool are noted within the colon. The colon is grossly unremarkable in appearance. The bladder is mildly distended and grossly unremarkable. The prostate remains normal in size. No inguinal lymphadenopathy is seen. No acute osseous abnormalities are identified. Review of the MIP images confirms the above findings. IMPRESSION: 1. Diffuse wall thickening and soft tissue inflammation involving a very long segment of distal ileum, extending to the level of the cecum, with a small amount of associated free fluid. No evidence for mesenteric ischemia. This may reflect infectious or inflammatory ileitis. Crohn's disease cannot be excluded, though the appearance is somewhat less typical for Crohn's disease. 2. Visualized vasculature is fully patent and unremarkable in appearance. No evidence of calcific atherosclerotic disease. Electronically Signed   By: Roanna RaiderJeffery  Chang M.D.   On: 04/23/2015 22:43  Impression/Plan: 40 yo with new onset of epigastric pain without associated N/V/D and CT findings showing a long inflamed segment of the distal ileum. I think his symptoms are due to gastroenteritis (bacterial vs viral). Doubt Crohn's disease. Doubt peptic ulcer disease. His pain is epigastric, which is not a typical location based on the area of inflammation on the CT scan but definitely could be related to the small bowel inflammation since the involved segment is long. Question dyspepsia as a source of his epigastric pain as well but would treat symptomatically and continue IV Abx until tomorrow and then transition to oral Abx if tolerating solid food. Hold off on colon/EGD at this time unless pain persisting. No obstructive symptoms. Reassurance given.  Advance diet to low fat diet. If feels ok and tolerating diet can go home tomorrow and f/u as an outpt. If not tolerating diet can consider adding Sucralfate slurry. Dr. Dulce Sellar will f/u tomorrow if necessary. Call us back if needed.    LOS: 0 days   Kieli Golladay C.  04/24/2015, 4:02 PM  Pager [phone]  If no answer or after 5 PM call [phone]

## 2015-04-24 NOTE — H&P (Signed)
[facility] History and Physical  Tony FretJames B Hickling ZOX:[id number] DOB: 08/20/1975 DOA: 04/23/2015  Referring physician: Dr.Irick. PCP: No primary care provider on file.  Specialists: None.  Chief Complaint: Abdominal pain.  HPI: Tony Robinson is a 40 y.o. male with no significant past medical history presents to the ER because of sudden onset abdominal pain since yesterday afternoon. Patient's pain is mostly in epigastric area. Denies any nausea vomiting or diarrhea denies any recent travel. Patient states his son has had some nausea vomiting with GI about 2 days ago. Patient denies any recent use of antibiotics. Drinks about 2-3 beers every night. CT of the abdomen and pelvis done shows inflammation involving a long segment of ileum and possible cecum. On exam patient does have some tenderness in suprapubic and right lower quadrant. Patient has been admitted for further management.   Review of Systems: As presented in the history of presenting illness, rest negative.  History reviewed. No pertinent past medical history. History reviewed. No pertinent past surgical history. Social History:  reports that he has been smoking Cigarettes.  He has been smoking about 0.50 packs per day. He does not have any smokeless tobacco history on file. He reports that he drinks alcohol. He reports that he does not use illicit drugs. Where does patient live home. Can patient participate in ADLs? Yes.  No Known Allergies  Family History:  Family History  Problem Relation Age of Onset  . Diabetes Mellitus II Mother       Prior to Admission medications   Medication Sig Start Date End Date Taking? Authorizing Provider  methocarbamol (ROBAXIN) 500 MG tablet Take 1-2 tablets (500-1,000 mg total) by mouth every 6 (six) hours as needed for muscle spasms. 10/13/14   Ozella Rocksavid J Merrell, MD  traMADol (ULTRAM) 50 MG tablet Take 1 tablet (50 mg total) by mouth every 6 (six) hours as needed. 06/26/13   Rodolph BongEvan S Corey,  MD    Physical Exam: Filed Vitals:   04/23/15 2330 04/24/15 0000 04/24/15 0100 04/24/15 0131  BP: 106/76 117/76 108/72 104/65  Pulse: 65 70 67 61  Temp:    98.4 F (36.9 C)  TempSrc:    Oral  Resp:    18  Height:    6' 2.5" (1.892 m)  Weight:    179 lb 8 oz (81.421 kg)  SpO2: 96% 99% 97% 99%     General:  Moderately built and nourished.  Eyes: Anicteric no pallor.  ENT: No discharge from the ears eyes nose and mouth.  Neck: No mass felt.  Cardiovascular: S1 and S2 heard.  Respiratory: No rhonchi or crepitations.  Abdomen: Mild tenderness in the suprapubic and right lower quadrant.  Skin: No rash.  Musculoskeletal: No edema.  Psychiatric: Appears normal.  Neurologic: Alert awake oriented to time place and person. Moves all extremities.  Labs on Admission:  Basic Metabolic Panel:  Recent Labs Lab 04/23/15 2041  NA 135  K 3.8  CL 101  CO2 22  GLUCOSE 153*  BUN 9  CREATININE 1.17  CALCIUM 9.5   Liver Function Tests:  Recent Labs Lab 04/23/15 2041  AST 24  ALT 26  ALKPHOS 57  BILITOT 0.5  PROT 6.8  ALBUMIN 4.0    Recent Labs Lab 04/23/15 2041  LIPASE 24   No results for input(s): AMMONIA in the last 168 hours. CBC:  Recent Labs Lab 04/23/15 2041  WBC 8.4  NEUTROABS 6.0  HGB 15.2  HCT 44.7  MCV 89.9  PLT 176   Cardiac Enzymes: No results for input(s): CKTOTAL, CKMB, CKMBINDEX, TROPONINI in the last 168 hours.  BNP (last 3 results) No results for input(s): BNP in the last 8760 hours.  ProBNP (last 3 results) No results for input(s): PROBNP in the last 8760 hours.  CBG: No results for input(s): GLUCAP in the last 168 hours.  Radiological Exams on Admission: Dg Abd 2 Views  04/23/2015  CLINICAL DATA:  Severe abdominal pain beginning today. EXAM: ABDOMEN - 2 VIEW COMPARISON:  None. FINDINGS: The bowel gas pattern is normal. There is no evidence of free air. No radio-opaque calculi or other significant radiographic abnormality  is seen. IMPRESSION: Negative. Electronically Signed   By: Amie Portland M.D.   On: 04/23/2015 20:57   Ct Cta Abd/pel W/cm &/or W/o Cm  04/23/2015  CLINICAL DATA:  Acute onset of generalized abdominal pain. Initial encounter. EXAM: CTA ABDOMEN AND PELVIS wITHOUT AND WITH CONTRAST TECHNIQUE: Multidetector CT imaging of the abdomen and pelvis was performed using the standard protocol during bolus administration of intravenous contrast. Multiplanar reconstructed images and MIPs were obtained and reviewed to evaluate the vascular anatomy. CONTRAST:  OMNIPAQUE IOHEXOL 350 MG/ML SOLN COMPARISON:  Abdominal radiograph performed earlier today at 8:34 p.m. FINDINGS: The visualized lung bases are clear. The liver and spleen are unremarkable in appearance. The gallbladder is within normal limits. The pancreas and adrenal glands are unremarkable. The kidneys are unremarkable in appearance. There is no evidence of hydronephrosis. No renal or ureteral stones are seen. No perinephric stranding is appreciated. There is diffuse wall thickening and soft tissue inflammation involving a very long segment of distal ileum, extending to the level of the cecum, with a small amount of associated free fluid. There is no evidence for mesenteric ischemia. The visualized vasculature appears intact. This may reflect infectious or inflammatory ileitis. Crohn's disease cannot be excluded, though the appearance is somewhat less typical for Crohn's disease. The more proximal small bowel is unremarkable. The stomach is within normal limits. No acute vascular abnormalities are seen. The abdominal aorta is fully patent. The celiac trunk, superior mesenteric artery, bilateral renal arteries and inferior mesenteric artery are fully patent. No calcific atherosclerotic disease is seen. The appendix is not well characterized; there is no evidence of appendicitis. Fluid and stool are noted within the colon. The colon is grossly unremarkable in  appearance. The bladder is mildly distended and grossly unremarkable. The prostate remains normal in size. No inguinal lymphadenopathy is seen. No acute osseous abnormalities are identified. Review of the MIP images confirms the above findings. IMPRESSION: 1. Diffuse wall thickening and soft tissue inflammation involving a very long segment of distal ileum, extending to the level of the cecum, with a small amount of associated free fluid. No evidence for mesenteric ischemia. This may reflect infectious or inflammatory ileitis. Crohn's disease cannot be excluded, though the appearance is somewhat less typical for Crohn's disease. 2. Visualized vasculature is fully patent and unremarkable in appearance. No evidence of calcific atherosclerotic disease. Electronically Signed   By: Roanna Raider M.D.   On: 04/23/2015 22:43     Assessment/Plan Principal Problem:   Ileitis Active Problems:   Enteritis   1. Abdominal pain with involvement of the distal ileum and cecum - differentials include inflammatory versus infectious. Extensive patient's son also had some GI symptoms we will treat for infectious symptoms with Cipro and Flagyl. For now I have kept patient nothing by mouth and IV fluids and pain relieving medications. If  pain persists may need GI consult inpatient otherwise patient may need to follow-up with GI as outpatient. Will advance diet as symptoms improve. If patient has any diarrhea may need stool studies per protocol as no diarrhea. 2. Tobacco abuse - tobacco cessation counseling requested. 3. Mild hyperglycemia - check hemoglobin A1c.  This patient exams every night and place patient on CIWA protocol.   DVT Prophylaxis SCDs for now.  Code Status: Full code.  Family Communication: Discussed with patient.  Disposition Plan: Admit to inpatient.    Jonthan Leite N. [facility] Pager [phone].  If 7PM-7AM, please contact night-coverage [email or web address] Password  [email or web address] 04/24/2015, 1:47 AM

## 2015-04-24 NOTE — Progress Notes (Signed)
[facility] PROGRESS NOTE  Tony Robinson:[id number] DOB: 1975-06-05 DOA: 04/23/2015 PCP: No primary care provider on file.  Assessment/Plan: 1. Abdominal pain -Patient presents with complaints of abdominal pain located in epigastric area with workup so far unrevealing. He has been afebrile, hemodynamically stable, having normal white count, lipase level , lactic acid level, LFTs. CT scan of abdomen and pelvis performed in the emergency department showing diffuse wall thickening and soft tissue inflammation involving distal ileum extending to cecum. Study did not reveal findings concerning for mesenteric ischemia. He denies nausea, vomiting, diarrhea, hematemesis, melena. -He has a history of tobacco abuse, reporting drinking beer on occasion, takes occasional NSAIDS, raises possibility of symptoms being related to peptic ulcer disease/gastritis. -Started PPI. Gave GI cocktail as well. Despite this intervention he continues to complain of severe epigastric pain. -Have spoken with GI who will evaluate patient. Await further recommendations. -Meanwhile continue empiric IV antibody therapy with ciprofloxacin and Flagyl.  Code Status: Full Code Family Communication: I spoke to his wife who was present at bedside Disposition Plan: GI consulted.    Consultants:  Dr Bosie Clos of GI  Antibiotics:  Cipro 04/24/2015  Flagyl 04/24/2015  HPI/Subjective: Tony Robinson is a 40 year old gentleman witha past medical history, admitted to the medicine service on 04/24/2015 when he presented with complaints of severe 10/10 abdominal pain. A CT scan of abdomen and pelvis performed in the emergency department revealed diffuse wall thickening and soft tissue inflammation of distal ileum extending to cecum. No evidence of mesenteric ischemia. Lab work has otherwise been unremarkable, having normal lipase level of 24, lactic acid of 0.85, white count of 6000, normal liver function tests. He has not had nausea  or vomiting.  Objective: Filed Vitals:   04/24/15 0131 04/24/15 0552  BP: 104/65 106/65  Pulse: 61 72  Temp: 98.4 F (36.9 C) 98.5 F (36.9 C)  Resp: 18 18    Intake/Output Summary (Last 24 hours) at 04/24/15 1518 Last data filed at 04/24/15 1400  Gross per 24 hour  Intake   3341 ml  Output      0 ml  Net   3341 ml   Filed Weights   04/23/15 1954 04/24/15 0131  Weight: 79.379 kg (175 lb) 81.421 kg (179 lb 8 oz)    Exam:   General:  Patient appearing anxious, in mild distress, reports ongoing abdominal pain.  Cardiovascular: Regular rate and rhythm normal S1-S2  Respiratory: Normal respiratory effort lungs are clear  Abdomen: He has pain to palpation over the epigastric region mainly, have some pain over periumbilical and lower abdominal region as well, no rebound tenderness or guarding  Musculoskeletal: No edema  Data Reviewed: Basic Metabolic Panel:  Recent Labs Lab 04/23/15 2041 04/24/15 0531  NA 135 139  K 3.8 3.7  CL 101 107  CO2 22 25  GLUCOSE 153* 94  BUN 9 7  CREATININE 1.17 0.96  CALCIUM 9.5 8.5*   Liver Function Tests:  Recent Labs Lab 04/23/15 2041 04/24/15 0531  AST 24 16  ALT 26 20  ALKPHOS 57 45  BILITOT 0.5 0.6  PROT 6.8 5.4*  ALBUMIN 4.0 3.1*    Recent Labs Lab 04/23/15 2041  LIPASE 24   No results for input(s): AMMONIA in the last 168 hours. CBC:  Recent Labs Lab 04/23/15 2041 04/24/15 0531  WBC 8.4 6.0  NEUTROABS 6.0  --   HGB 15.2 13.5  HCT 44.7 39.7  MCV 89.9 90.8  PLT 176 155   Cardiac  Enzymes: No results for input(s): CKTOTAL, CKMB, CKMBINDEX, TROPONINI in the last 168 hours. BNP (last 3 results) No results for input(s): BNP in the last 8760 hours.  ProBNP (last 3 results) No results for input(s): PROBNP in the last 8760 hours.  CBG:  Recent Labs Lab 04/24/15 0156 04/24/15 0414 04/24/15 0557 04/24/15 1145  GLUCAP 98 90 86 128*    No results found for this or any previous visit (from the past  240 hour(s)).   Studies: Dg Abd 2 Views  04/23/2015  CLINICAL DATA:  Severe abdominal pain beginning today. EXAM: ABDOMEN - 2 VIEW COMPARISON:  None. FINDINGS: The bowel gas pattern is normal. There is no evidence of free air. No radio-opaque calculi or other significant radiographic abnormality is seen. IMPRESSION: Negative. Electronically Signed   By: Amie Portland M.D.   On: 04/23/2015 20:57   Ct Cta Abd/pel W/cm &/or W/o Cm  04/23/2015  CLINICAL DATA:  Acute onset of generalized abdominal pain. Initial encounter. EXAM: CTA ABDOMEN AND PELVIS wITHOUT AND WITH CONTRAST TECHNIQUE: Multidetector CT imaging of the abdomen and pelvis was performed using the standard protocol during bolus administration of intravenous contrast. Multiplanar reconstructed images and MIPs were obtained and reviewed to evaluate the vascular anatomy. CONTRAST:  OMNIPAQUE IOHEXOL 350 MG/ML SOLN COMPARISON:  Abdominal radiograph performed earlier today at 8:34 p.m. FINDINGS: The visualized lung bases are clear. The liver and spleen are unremarkable in appearance. The gallbladder is within normal limits. The pancreas and adrenal glands are unremarkable. The kidneys are unremarkable in appearance. There is no evidence of hydronephrosis. No renal or ureteral stones are seen. No perinephric stranding is appreciated. There is diffuse wall thickening and soft tissue inflammation involving a very long segment of distal ileum, extending to the level of the cecum, with a small amount of associated free fluid. There is no evidence for mesenteric ischemia. The visualized vasculature appears intact. This may reflect infectious or inflammatory ileitis. Crohn's disease cannot be excluded, though the appearance is somewhat less typical for Crohn's disease. The more proximal small bowel is unremarkable. The stomach is within normal limits. No acute vascular abnormalities are seen. The abdominal aorta is fully patent. The celiac trunk, superior  mesenteric artery, bilateral renal arteries and inferior mesenteric artery are fully patent. No calcific atherosclerotic disease is seen. The appendix is not well characterized; there is no evidence of appendicitis. Fluid and stool are noted within the colon. The colon is grossly unremarkable in appearance. The bladder is mildly distended and grossly unremarkable. The prostate remains normal in size. No inguinal lymphadenopathy is seen. No acute osseous abnormalities are identified. Review of the MIP images confirms the above findings. IMPRESSION: 1. Diffuse wall thickening and soft tissue inflammation involving a very long segment of distal ileum, extending to the level of the cecum, with a small amount of associated free fluid. No evidence for mesenteric ischemia. This may reflect infectious or inflammatory ileitis. Crohn's disease cannot be excluded, though the appearance is somewhat less typical for Crohn's disease. 2. Visualized vasculature is fully patent and unremarkable in appearance. No evidence of calcific atherosclerotic disease. Electronically Signed   By: Roanna Raider M.D.   On: 04/23/2015 22:43    Scheduled Meds: . ciprofloxacin  400 mg Intravenous Q12H  . folic acid  1 mg Oral Daily  . metronidazole  500 mg Intravenous Q8H  . multivitamin with minerals  1 tablet Oral Daily  . pantoprazole  40 mg Oral Daily  . thiamine  100  mg Oral Daily   Continuous Infusions: . sodium chloride      Principal Problem:   Ileitis Active Problems:   Enteritis    Time spent: 25 min   Jeralyn BennettZAMORA, Alaijah Gibler  [facility] Pager [phone]. If 7PM-7AM, please contact night-coverage at [email or web address], password [email or web address] 04/24/2015, 3:18 PM  LOS: 0 days

## 2015-04-24 NOTE — Progress Notes (Signed)
Pharmacy Antibiotic Note  Tony Robinson is a 40 y.o. male admitted on 04/23/2015 with abdominal pain.  Pharmacy has been consulted for Cipro dosing. WBC WNL. Renal function good. CT scan with inflammation/thickening.   Plan: -Cipro 400 mg IV q12h -Flagyl per MD -F/U infectious work-up  Height: 6' 2.5" (189.2 cm) Weight: 179 lb 8 oz (81.421 kg) IBW/kg (Calculated) : 83.35  Temp (24hrs), Avg:98.4 F (36.9 C), Min:98.3 F (36.8 C), Max:98.4 F (36.9 C)   Recent Labs Lab 04/23/15 2041 04/23/15 2154 04/24/15 0020  WBC 8.4  --   --   CREATININE 1.17  --   --   LATICACIDVEN  --  0.85 0.34*    Estimated Creatinine Clearance: 98.6 mL/min (by C-G formula based on Cr of 1.17).    No Known Allergies  Tony Robinson, Tony Robinson 04/24/2015 1:51 AM

## 2015-04-24 NOTE — Care Management Note (Signed)
Case Management Note  Patient Details  Name: Tony Robinson MRN: [id number] Date of Birth: 10/12/1975  Subjective/Objective:                    Action/Plan:   Expected Discharge Date:                  Expected Discharge Plan:  Home/Self Care  In-House Referral:     Discharge planning Services     Post Acute Care Choice:    Choice offered to:     DME Arranged:    DME Agency:     HH Arranged:    HH Agency:     Status of Service:  In process, will continue to follow  Medicare Important Message Given:    Date Medicare IM Given:    Medicare IM give by:    Date Additional Medicare IM Given:    Additional Medicare Important Message give by:     If discussed at Long Length of Stay Meetings, dates discussed:    Additional Comments:  Tony Robinson, Tony Behan Marie, RN 04/24/2015, 3:26 PM

## 2015-04-25 DIAGNOSIS — K529 Noninfective gastroenteritis and colitis, unspecified: Secondary | ICD-10-CM | POA: Diagnosis not present

## 2015-04-25 LAB — CBC
HEMATOCRIT: 40 % (ref 39.0–52.0)
HEMOGLOBIN: 13.3 g/dL (ref 13.0–17.0)
MCH: 30.2 pg (ref 26.0–34.0)
MCHC: 33.3 g/dL (ref 30.0–36.0)
MCV: 90.9 fL (ref 78.0–100.0)
PLATELETS: 154 10*3/uL (ref 150–400)
RBC: 4.4 MIL/uL (ref 4.22–5.81)
RDW: 13.5 % (ref 11.5–15.5)
WBC: 6.6 10*3/uL (ref 4.0–10.5)

## 2015-04-25 LAB — BASIC METABOLIC PANEL
ANION GAP: 8 (ref 5–15)
BUN: 7 mg/dL (ref 6–20)
CALCIUM: 8.9 mg/dL (ref 8.9–10.3)
CO2: 26 mmol/L (ref 22–32)
Chloride: 106 mmol/L (ref 101–111)
Creatinine, Ser: 1.01 mg/dL (ref 0.61–1.24)
Glucose, Bld: 88 mg/dL (ref 65–99)
POTASSIUM: 3.9 mmol/L (ref 3.5–5.1)
SODIUM: 140 mmol/L (ref 135–145)

## 2015-04-25 LAB — GLUCOSE, CAPILLARY
Glucose-Capillary: 76 mg/dL (ref 65–99)
Glucose-Capillary: 81 mg/dL (ref 65–99)

## 2015-04-25 MED ORDER — MORPHINE SULFATE (PF) 2 MG/ML IV SOLN: 2.0000 mg | INTRAVENOUS | Status: DC | PRN

## 2015-04-25 MED ORDER — OXYCODONE HCL 5 MG PO TABS
10.0000 mg | ORAL_TABLET | ORAL | Status: DC | PRN
  Filled 2015-04-25: qty 2

## 2015-04-25 MED ORDER — PANTOPRAZOLE SODIUM 40 MG PO TBEC: 40.0000 mg | DELAYED_RELEASE_TABLET | Freq: Every day | ORAL | Status: DC

## 2015-04-25 MED ORDER — CIPROFLOXACIN HCL 500 MG PO TABS: 500.0000 mg | ORAL_TABLET | Freq: Two times a day (BID) | ORAL | Status: DC

## 2015-04-25 MED ORDER — SUCRALFATE 1 GM/10ML PO SUSP: 1.0000 g | Freq: Three times a day (TID) | ORAL | Status: DC

## 2015-04-25 MED ORDER — OXYCODONE HCL 10 MG PO TABS: 10.0000 mg | ORAL_TABLET | Freq: Four times a day (QID) | ORAL | Status: DC | PRN

## 2015-04-25 MED ORDER — METRONIDAZOLE 500 MG PO TABS: 500.0000 mg | ORAL_TABLET | Freq: Three times a day (TID) | ORAL | Status: DC

## 2015-04-25 NOTE — Progress Notes (Signed)
Pt discharged to home accompanied by wife.  Rx's given and explained.  No questions verbalized about home self care.

## 2015-04-25 NOTE — Discharge Summary (Signed)
Physician Discharge Summary  Tony Robinson ZOX:[id number] DOB: 1976/01/03 DOA: 04/23/2015  PCP: No primary care provider on file.  Admit date: 04/23/2015 Discharge date: 04/25/2015  Time spent: 35 minutes  Recommendations for Outpatient Follow-up:  1. Please follow-up on abdominal pain, symptoms improved by day of discharge. During this hospitalization he was seen and evaluated by GI. Given GI follow-up appointment on discharge   Discharge Diagnoses:  Principal Problem:   Ileitis Active Problems:   Enteritis   Discharge Condition: Stable  Diet recommendation: Soft diet  Filed Weights   04/23/15 1954 04/24/15 0131  Weight: 79.379 kg (175 lb) 81.421 kg (179 lb 8 oz)    History of present illness:  Tony Robinson is a 40 y.o. male with no significant past medical history presents to the ER because of sudden onset abdominal pain since yesterday afternoon. Patient's pain is mostly in epigastric area. Denies any nausea vomiting or diarrhea denies any recent travel. Patient states his son has had some nausea vomiting with GI about 2 days ago. Patient denies any recent use of antibiotics. Drinks about 2-3 beers every night. CT of the abdomen and pelvis done shows inflammation involving a long segment of ileum and possible cecum. On exam patient does have some tenderness in suprapubic and right lower quadrant. Patient has been admitted for further management.   Hospital Course:  Tony Robinson is a 40 year old gentleman witha past medical history, admitted to the medicine service on 04/24/2015 when he presented with complaints of severe 10/10 abdominal pain. A CT scan of abdomen and pelvis performed in the emergency department revealed diffuse wall thickening and soft tissue inflammation of distal ileum extending to cecum. No evidence of mesenteric ischemia. Lab work has otherwise been unremarkable, having normal lipase level of 24, lactic acid of 0.85, white count of 6000, normal liver function tests.  He has not had nausea or vomiting. He showed gradual clinical improvement and by 04/25/2015 felt well enough to go home. During this hospitalization he was seen and evaluated by GI without symptoms were likely related to colitis. They did not recommend endoscopy/colonoscopy unless symptoms worsened. Given clinical improvement he was discharged and given follow-up at the office in 1 week. He was discharged on a Protonix, Carafate, and 5 days of antibiotic therapy with ciprofloxacin and Flagyl.   Consultations:  GI  Discharge Exam: Filed Vitals:   04/25/15 0523 04/25/15 1356  BP: 110/73 114/74  Pulse: 60 87  Temp: 98.3 F (36.8 C) 97.9 F (36.6 C)  Resp: 17 18     General: He is nontoxic appearing awake and alert  Cardiovascular: Regular rate and rhythm normal S1-S2  Respiratory: Normal respiratory effort lungs are clear  Abdomen: There seems to be interval improvement in abdominal findings, decreased pain to palpation  Musculoskeletal: No edema  Discharge Instructions   Discharge Instructions    Call MD for:  difficulty breathing, headache or visual disturbances    Complete by:  As directed      Call MD for:  extreme fatigue    Complete by:  As directed      Call MD for:  hives    Complete by:  As directed      Call MD for:  persistant dizziness or light-headedness    Complete by:  As directed      Call MD for:  persistant nausea and vomiting    Complete by:  As directed      Call MD for:  redness, tenderness, or  signs of infection (pain, swelling, redness, odor or green/yellow discharge around incision site)    Complete by:  As directed      Call MD for:  severe uncontrolled pain    Complete by:  As directed      Call MD for:  temperature >100.4    Complete by:  As directed      Call MD for:    Complete by:  As directed      Diet - low sodium heart healthy    Complete by:  As directed      Increase activity slowly    Complete by:  As directed            Current Discharge Medication List    START taking these medications   Details  oxyCODONE 10 MG TABS Take 1 tablet (10 mg total) by mouth every 6 (six) hours as needed for severe pain. Qty: 12 tablet, Refills: 0    pantoprazole (PROTONIX) 40 MG tablet Take 1 tablet (40 mg total) by mouth daily. Qty: 30 tablet, Refills: 0    sucralfate (CARAFATE) 1 GM/10ML suspension Take 10 mLs (1 g total) by mouth 4 (four) times daily -  with meals and at bedtime. Qty: 420 mL, Refills: 0      CONTINUE these medications which have NOT CHANGED   Details  methocarbamol (ROBAXIN) 500 MG tablet Take 1-2 tablets (500-1,000 mg total) by mouth every 6 (six) hours as needed for muscle spasms. Qty: 60 tablet, Refills: 0      STOP taking these medications     traMADol (ULTRAM) 50 MG tablet        No Known Allergies Follow-up Information    Follow up with Tony C., MD In 1 week.   Specialty:  Gastroenterology   Contact information:   [address] [place] [address] [phone]        The results of significant diagnostics from this hospitalization (including imaging, microbiology, ancillary and laboratory) are listed below for reference.    Significant Diagnostic Studies: Dg Abd 2 Views  04/23/2015  CLINICAL DATA:  Severe abdominal pain beginning today. EXAM: ABDOMEN - 2 VIEW COMPARISON:  None. FINDINGS: The bowel gas pattern is normal. There is no evidence of free air. No radio-opaque calculi or other significant radiographic abnormality is seen. IMPRESSION: Negative. Electronically Signed   By: Amie Portlandavid  Ormond M.D.   On: 04/23/2015 20:57   Ct Cta Abd/pel W/cm &/or W/o Cm  04/23/2015  CLINICAL DATA:  Acute onset of generalized abdominal pain. Initial encounter. EXAM: CTA ABDOMEN AND PELVIS wITHOUT AND WITH CONTRAST TECHNIQUE: Multidetector CT imaging of the abdomen and pelvis was performed using the standard protocol during bolus administration of intravenous  contrast. Multiplanar reconstructed images and MIPs were obtained and reviewed to evaluate the vascular anatomy. CONTRAST:  100mL OMNIPAQUE IOHEXOL 350 MG/ML SOLN COMPARISON:  Abdominal radiograph performed earlier today at 8:34 p.m. FINDINGS: The visualized lung bases are clear. The liver and spleen are unremarkable in appearance. The gallbladder is within normal limits. The pancreas and adrenal glands are unremarkable. The kidneys are unremarkable in appearance. There is no evidence of hydronephrosis. No renal or ureteral stones are seen. No perinephric stranding is appreciated. There is diffuse wall thickening and soft tissue inflammation involving a very long segment of distal ileum, extending to the level of the cecum, with a small amount of associated free fluid. There is no evidence for mesenteric ischemia. The visualized vasculature appears intact.  This may reflect infectious or inflammatory ileitis. Crohn's disease cannot be excluded, though the appearance is somewhat less typical for Crohn's disease. The more proximal small bowel is unremarkable. The stomach is within normal limits. No acute vascular abnormalities are seen. The abdominal aorta is fully patent. The celiac trunk, superior mesenteric artery, bilateral renal arteries and inferior mesenteric artery are fully patent. No calcific atherosclerotic disease is seen. The appendix is not well characterized; there is no evidence of appendicitis. Fluid and stool are noted within the colon. The colon is grossly unremarkable in appearance. The bladder is mildly distended and grossly unremarkable. The prostate remains normal in size. No inguinal lymphadenopathy is seen. No acute osseous abnormalities are identified. Review of the MIP images confirms the above findings. IMPRESSION: 1. Diffuse wall thickening and soft tissue inflammation involving a very long segment of distal ileum, extending to the level of the cecum, with a small amount of associated free  fluid. No evidence for mesenteric ischemia. This may reflect infectious or inflammatory ileitis. Crohn's disease cannot be excluded, though the appearance is somewhat less typical for Crohn's disease. 2. Visualized vasculature is fully patent and unremarkable in appearance. No evidence of calcific atherosclerotic disease. Electronically Signed   By: Roanna Raider M.D.   On: 04/23/2015 22:43    Microbiology: No results found for this or any previous visit (from the past 240 hour(s)).   Labs: Basic Metabolic Panel:  Recent Labs Lab 04/23/15 2041 04/24/15 0531 04/25/15 0544  NA 135 139 140  K 3.8 3.7 3.9  CL 101 107 106  CO2 GLUCOSE 153* 94 88  BUN CREATININE 1.17 0.96 1.01  CALCIUM 9.5 8.5* 8.9   Liver Function Tests:  Recent Labs Lab 04/23/15 2041 04/24/15 0531  AST 24 16  ALT 26 20  ALKPHOS 57 45  BILITOT 0.5 0.6  PROT 6.8 5.4*  ALBUMIN 4.0 3.1*    Recent Labs Lab 04/23/15 2041  LIPASE 24   No results for input(s): AMMONIA in the last 168 hours. CBC:  Recent Labs Lab 04/23/15 2041 04/24/15 0531 04/25/15 0544  WBC 8.4 6.0 6.6  NEUTROABS 6.0  --   --   HGB 15.2 13.5 13.3  HCT 44.7 39.7 40.0  MCV 89.9 90.8 90.9  PLT 176 155 154   Cardiac Enzymes: No results for input(s): CKTOTAL, CKMB, CKMBINDEX, TROPONINI in the last 168 hours. BNP: BNP (last 3 results) No results for input(s): BNP in the last 8760 hours.  ProBNP (last 3 results) No results for input(s): PROBNP in the last 8760 hours.  CBG:  Recent Labs Lab 04/24/15 0557 04/24/15 1145 04/24/15 1751 04/25/15 0010 04/25/15 0521  GLUCAP 86 128* 97 76 81       Signed:  Jeralyn Bennett MD.  [facility] 04/25/2015, 4:07 PM

## 2015-04-25 NOTE — Progress Notes (Signed)
Subjective: Still having some crampy and sharp periumbilical and epigastric pain; is overall slowly improving. No bowel movement.  Objective: Vital signs in last 24 hours: Temp:  [97.9 F (36.6 C)-98.4 F (36.9 C)] 97.9 F (36.6 C) (04/01 1356) Pulse Rate:  [58-87] 87 (04/01 1356) Resp:  [17-18] 18 (04/01 1356) BP: (107-114)/(69-74) 114/74 mmHg (04/01 1356) SpO2:  [97 %-99 %] 98 % (04/01 1356) Weight change:  Last BM Date: 04/23/15  PE: GEN:  NAD ABD:  Mild epigastric tenderness, no peritonitis.  Lab Results: CBC    Component Value Date/Time   WBC 6.6 04/25/2015 0544   RBC 4.40 04/25/2015 0544   HGB 13.3 04/25/2015 0544   HCT 40.0 04/25/2015 0544   PLT 154 04/25/2015 0544   MCV 90.9 04/25/2015 0544   MCH 30.2 04/25/2015 0544   MCHC 33.3 04/25/2015 0544   RDW 13.5 04/25/2015 0544   LYMPHSABS 1.5 04/23/2015 2041   MONOABS 0.9 04/23/2015 2041   EOSABS 0.0 04/23/2015 2041   BASOSABS 0.0 04/23/2015 2041   CMP     Component Value Date/Time   NA 140 04/25/2015 0544   K 3.9 04/25/2015 0544   CL 106 04/25/2015 0544   CO2 26 04/25/2015 0544   GLUCOSE 88 04/25/2015 0544   BUN 7 04/25/2015 0544   CREATININE 1.01 04/25/2015 0544   CALCIUM 8.9 04/25/2015 0544   PROT 5.4* 04/24/2015 0531   ALBUMIN 3.1* 04/24/2015 0531   AST 16 04/24/2015 0531   ALT 20 04/24/2015 0531   ALKPHOS 45 04/24/2015 0531   BILITOT 0.6 04/24/2015 0531   GFRNONAA >60 04/25/2015 0544   GFRAA >60 04/25/2015 0544   Assessment:  1.  Abdominal pain. 2.  Abnormal CT abdomen, distal ileal and cecal wall thickening.  Plan:  1.  Downgrade diet to clear liquids. 2.  Add sucralfate suspension. 3.  Will follow; if patient doesn't continue to improve over the next couple days, might consider colonoscopy +/- endoscopy. 4.  [facility] will follow.   Freddy JakschOUTLAW,Tony Robinson 04/25/2015, 2:40 PM   Pager [phone] If no answer or after 5 PM call [phone]

## 2015-04-27 LAB — HEMOGLOBIN A1C
Hgb A1c MFr Bld: 5.4 % (ref 4.8–5.6)
Mean Plasma Glucose: 108 mg/dL

## 2016-02-22 ENCOUNTER — Encounter (HOSPITAL_COMMUNITY): Payer: Self-pay | Admitting: Emergency Medicine

## 2016-02-22 ENCOUNTER — Ambulatory Visit (HOSPITAL_COMMUNITY)
Admission: EM | Admit: 2016-02-22 | Discharge: 2016-02-22 | Disposition: A | Discharge status: Home / Self Care | Payer: 59 | Attending: Emergency Medicine | Admitting: Emergency Medicine

## 2016-02-22 DIAGNOSIS — R52 Pain, unspecified: Secondary | ICD-10-CM | POA: Diagnosis not present

## 2016-02-22 DIAGNOSIS — R69 Illness, unspecified: Secondary | ICD-10-CM

## 2016-02-22 DIAGNOSIS — J111 Influenza due to unidentified influenza virus with other respiratory manifestations: Secondary | ICD-10-CM

## 2016-02-22 MED ORDER — OSELTAMIVIR PHOSPHATE 75 MG PO CAPS: 75.0000 mg | ORAL_CAPSULE | Freq: Two times a day (BID) | ORAL | 0 refills | Status: DC

## 2016-02-22 MED ORDER — SCOPOLAMINE 1 MG/3DAYS TD PT72: 1.0000 | MEDICATED_PATCH | TRANSDERMAL | 0 refills | Status: DC

## 2016-02-22 NOTE — Discharge Instructions (Signed)
Sudafed PE 10 mg every 4 to 6 hours as needed for congestion °Allegra or Zyrtec daily as needed for drainage and runny nose. °For stronger antihistamine may take Chlor-Trimeton 2 to 4 mg every 4 to 6 hours, may cause drowsiness. °Saline nasal spray used frequently. °Ibuprofen 600 mg every 6 hours as needed for pain, discomfort or fever. °Drink plenty of fluids and stay well-hydrated. °

## 2016-02-22 NOTE — ED Provider Notes (Signed)
CSN: [id number]     Arrival date & time 02/22/16  1021 History   First MD Initiated Contact with Patient 02/22/16 1143     Chief Complaint  Patient presents with  . Generalized Body Aches   (Consider location/radiation/quality/duration/timing/severity/associated sxs/prior Treatment) 41 year old male with a two-day history of chills, body aches, headache, earaches, dry mouth and possible fever. Has not been measured. He did not get a flu shot this year.  Denies shortness of breath or GI symptoms. He is also going on a cruise in 1 week is requesting a prescription for transdermal patch for motion sickness.      History reviewed. No pertinent past medical history. History reviewed. No pertinent surgical history. Family History  Problem Relation Age of Onset  . Diabetes Mellitus II Mother    Social History  Substance Use Topics  . Smoking status: Current Every Day Smoker    Packs/day: 0.50    Types: Cigarettes  . Smokeless tobacco: Not on file  . Alcohol use Yes    Review of Systems  Constitutional: Positive for activity change, chills and fever. Negative for diaphoresis and fatigue.  HENT: Positive for rhinorrhea and sore throat. Negative for ear pain, facial swelling, postnasal drip and trouble swallowing.   Eyes: Negative for pain, discharge and redness.  Respiratory: Positive for cough. Negative for chest tightness.   Cardiovascular: Negative.   Gastrointestinal: Negative.   Musculoskeletal: Negative.  Negative for neck pain and neck stiffness.  Skin: Negative.   Neurological: Negative.   All other systems reviewed and are negative.   Allergies  Patient has no known allergies.  Home Medications   Prior to Admission medications   Medication Sig Start Date End Date Taking? Authorizing Provider  oseltamivir (TAMIFLU) 75 MG capsule Take 1 capsule (75 mg total) by mouth 2 (two) times daily. X 5 days 02/22/16   Hayden Rasmussen, NP  scopolamine (TRANSDERM-SCOP, 1.5 MG,) 1  MG/3DAYS Place 1 patch (1.5 mg total) onto the skin every 3 (three) days. 02/22/16   Hayden Rasmussen, NP   Meds Ordered and Administered this Visit  Medications - No data to display  BP 102/67 (BP Location: Right Arm)   Pulse 102   Temp 99.1 F (37.3 C) (Oral)   Resp 20   SpO2 98%  No data found.   Physical Exam  Constitutional: He is oriented to person, place, and time. He appears well-developed and well-nourished. No distress.  HENT:  Head: Normocephalic and atraumatic.  Right Ear: External ear normal.  Left Ear: External ear normal.  Bilateral TMs are  prim obscuredarily by cerumen.  Oropharynx with much cobblestoning and minor erythema and clear PND. No exudates   Eyes: EOM are normal. Pupils are equal, round, and reactive to light.  Neck: Normal range of motion. Neck supple.  Cardiovascular: Normal rate, regular rhythm, normal heart sounds and intact distal pulses.   Pulmonary/Chest: Effort normal and breath sounds normal. No respiratory distress. He has no wheezes. He has no rales.  Musculoskeletal: Normal range of motion. He exhibits no edema.  Lymphadenopathy:    He has no cervical adenopathy.  Neurological: He is alert and oriented to person, place, and time.  Skin: Skin is warm and dry. No rash noted.  Psychiatric: He has a normal mood and affect.  Nursing note and vitals reviewed.   Urgent Care Course     Procedures (including critical care time)  Labs Review Labs Reviewed - No data to display  Imaging Review No results found.  Visual Acuity Review  Right Eye Distance:   Left Eye Distance:   Bilateral Distance:    Right Eye Near:   Left Eye Near:    Bilateral Near:         MDM   1. Influenza-like illness   2. Body aches    Sudafed PE 10 mg every 4 to 6 hours as needed for congestion Allegra or Zyrtec daily as needed for drainage and runny nose. For stronger antihistamine may take Chlor-Trimeton 2 to 4 mg every 4 to 6 hours, may cause  drowsiness. Saline nasal spray used frequently. Ibuprofen 600 mg every 6 hours as needed for pain, discomfort or fever. Drink plenty of fluids and stay well-hydrated. Meds ordered this encounter  Medications  . oseltamivir (TAMIFLU) 75 MG capsule    Sig: Take 1 capsule (75 mg total) by mouth 2 (two) times daily. X 5 days    Dispense:  10 capsule    Refill:  0    Order Specific Question:   Supervising Provider    Answer:   Charm RingsHONIG, ERIN J [id number]]  . scopolamine (TRANSDERM-SCOP, 1.5 MG,) 1 MG/3DAYS    Sig: Place 1 patch (1.5 mg total) onto the skin every 3 (three) days.    Dispense:  4 patch    Refill:  0    Order Specific Question:   Supervising Provider    Answer:   Micheline ChapmanHONIG, ERIN J [[id number]]       Hayden Rasmussenavid Shannelle Alguire, NP 02/22/16 1300

## 2016-02-22 NOTE — ED Triage Notes (Signed)
[facility] a complaint of general body aches, fatigue and dry mouth x 3 days.

## 2017-04-10 ENCOUNTER — Encounter (HOSPITAL_COMMUNITY): Payer: Self-pay | Admitting: Emergency Medicine

## 2017-04-10 ENCOUNTER — Ambulatory Visit (INDEPENDENT_AMBULATORY_CARE_PROVIDER_SITE_OTHER): Payer: 59

## 2017-04-10 ENCOUNTER — Ambulatory Visit (HOSPITAL_COMMUNITY)
Admission: EM | Admit: 2017-04-10 | Discharge: 2017-04-10 | Disposition: A | Discharge status: Home / Self Care | Payer: 59 | Attending: Internal Medicine | Admitting: Internal Medicine

## 2017-04-10 DIAGNOSIS — R0789 Other chest pain: Secondary | ICD-10-CM

## 2017-04-10 DIAGNOSIS — H1031 Unspecified acute conjunctivitis, right eye: Secondary | ICD-10-CM | POA: Diagnosis not present

## 2017-04-10 MED ORDER — POLYMYXIN B-TRIMETHOPRIM 10000-0.1 UNIT/ML-% OP SOLN: 1.0000 [drp] | OPHTHALMIC | 0 refills | Status: AC

## 2017-04-10 NOTE — ED Provider Notes (Signed)
[facility]    CSN: [id number] Arrival date & time: 04/10/17  1117     History   Chief Complaint Chief Complaint  Patient presents with  . Conjunctivitis  . rib pain    HPI Tony Robinson is a 42 y.o. male no contributing past medical history presenting today with right eye drainage and irritation as well as left-sided rib pain.  He woke up this morning with his eye crusted shut, and has since had a dry irritated feeling to the eye.  He denies any persistent watery drainage or thick drainage.  Has had occasional blurry vision.  Denies pain.  Denies use of contacts or glasses.  Has had slight redness to his eye.  Has 2 younger kids at home, they have not had similar symptoms.  His wife has been sick.  Also noting left-sided rib pain that worsens with breathing.  This is been going off and on for the past 2 weeks.  3 days ago it was a stabbing sensation and was very painful, and has decreased and turned into a dull and sore sensation since.  Denies any specific injury.  Has had mild cough that started today, but no cough preceding onset of pain.  Denies chest pain.  Did have shortness of breath when pain was more severe.  Denies any nausea, vomiting, abdominal pain.  Has approximately 15-year pack year history of smoking.  Mild cough and congestion that began today, but no true URI symptom complaints.  HPI  History reviewed. No pertinent past medical history.  Patient Active Problem List   Diagnosis Date Noted  . Ileitis 04/24/2015  . Enteritis 04/24/2015  . STREPTOCOCCAL PHARYNGITIS 05/01/2007  . HEMORRHOIDS 11/03/2005    History reviewed. No pertinent surgical history.     Home Medications    Prior to Admission medications   Medication Sig Start Date End Date Taking? Authorizing Provider  oseltamivir (TAMIFLU) 75 MG capsule Take 1 capsule (75 mg total) by mouth 2 (two) times daily. X 5 days 02/22/16   Hayden Rasmussen, NP  scopolamine (TRANSDERM-SCOP, 1.5 MG,) 1  MG/3DAYS Place 1 patch (1.5 mg total) onto the skin every 3 (three) days. 02/22/16   Hayden Rasmussen, NP  trimethoprim-polymyxin b (POLYTRIM) ophthalmic solution Place 1 drop into the right eye every 4 (four) hours for 7 days. 04/10/17 04/17/17  Wade Sigala, Junius Creamer, PA-C    Family History Family History  Problem Relation Age of Onset  . Diabetes Mellitus II Mother     Social History Social History   Tobacco Use  . Smoking status: Current Every Day Smoker    Packs/day: 0.50    Types: Cigarettes  Substance Use Topics  . Alcohol use: Yes  . Drug use: No     Allergies   Patient has no known allergies.   Review of Systems Review of Systems  Constitutional: Negative for activity change, appetite change, fatigue and fever.  HENT: Negative for congestion, ear pain, postnasal drip, rhinorrhea, sinus pressure and sore throat.   Eyes: Positive for discharge, redness, itching and visual disturbance. Negative for pain.  Respiratory: Positive for cough. Negative for shortness of breath.   Cardiovascular: Positive for chest pain.  Gastrointestinal: Negative for abdominal pain, diarrhea, nausea and vomiting.  Musculoskeletal: Positive for myalgias.  Skin: Negative for rash.  Neurological: Negative for dizziness, light-headedness and headaches.     Physical Exam Triage Vital Signs ED Triage Vitals [04/10/17 1201]  Enc Vitals Group     BP 116/83  Pulse Rate 83     Resp 18     Temp 98.6 F (37 C)     Temp Source Oral     SpO2 100 %     Weight      Height      Head Circumference      Peak Flow      Pain Score      Pain Loc      Pain Edu?      Excl. in GC?    No data found.  Updated Vital Signs BP 116/83 (BP Location: Left Arm)   Pulse 83   Temp 98.6 F (37 C) (Oral)   Resp 18   SpO2 100%   Visual Acuity Right Eye Distance:   20/15 Left Eye Distance:  20/15 Bilateral Distance:  20/13  Right Eye Near:   Left Eye Near:    Bilateral Near:     Physical Exam    Constitutional: He is oriented to person, place, and time. He appears well-developed and well-nourished.  HENT:  Head: Normocephalic and atraumatic.  Bilateral EACs with significant amount of cerumen, bilateral TMs partially visualized without erythema.  Nasal mucosa mildly erythematous, no rhinorrhea.  Posterior oropharynx mildly erythematous, no tonsillar exudate or enlargement.  Eyes: Conjunctivae and EOM are normal. Pupils are equal, round, and reactive to light.  Right eye with mild conjunctival erythema, discharge expressed on exam, no foreign bodies observed.  Neck: Neck supple.  Cardiovascular: Normal rate and regular rhythm.  No murmur heard. Pulmonary/Chest: Effort normal and breath sounds normal. No respiratory distress.  Breathing comfortably at rest, CTA BL, no adventitious sounds appreciated  Abdominal: Soft. There is no tenderness.  Musculoskeletal: He exhibits no edema or tenderness.  No tenderness to palpation of left lower ribs musculature, mild discomfort.  No crepitus appreciated  Neurological: He is alert and oriented to person, place, and time.  Skin: Skin is warm and dry.  Psychiatric: He has a normal mood and affect.  Nursing note and vitals reviewed.    UC Treatments / Results  Labs (all labs ordered are listed, but only abnormal results are displayed) Labs Reviewed - No data to display  EKG  EKG Interpretation None       Radiology No results found.  Procedures Procedures (including critical care time)  Medications Ordered in UC Medications - No data to display   Initial Impression / Assessment and Plan / UC Course  I have reviewed the triage vital signs and the nursing notes.  Pertinent labs & imaging results that were available during my care of the patient were reviewed by me and considered in my medical decision making (see chart for details).     Patient with likely viral conjunctivitis, will provide Polytrim to treat for possible  bacterial and to help with irritation.  Rib pain likely musculoskeletal versus inflammatory.  Chest x-ray negative.  Will recommend NSAIDs consistently over the next week.  Discussed strict return precautions. Patient verbalized understanding and is agreeable with plan.   Final Clinical Impressions(s) / UC Diagnoses   Final diagnoses:  Acute conjunctivitis of right eye, unspecified acute conjunctivitis type  Chest wall pain    ED Discharge Orders        Ordered    trimethoprim-polymyxin b (POLYTRIM) ophthalmic solution  Every 4 hours     04/10/17 1314       Controlled Substance Prescriptions [place] Controlled Substance Registry consulted? Not Applicable   Lew DawesWieters, Riyaan Heroux C, [place]-C 04/10/17 1337

## 2017-04-10 NOTE — ED Triage Notes (Signed)
Pt here with right eye drainage with irritation; pt sts left sided rib pain worse with inspiration x 2 weeks

## 2017-04-10 NOTE — Discharge Instructions (Addendum)
Viral Conjunctivitis- this is self-limiting and will improve on its own, may worsen for 3-5 days, but should resolve in 10-14 days  - Have Good Hand Hygiene  - Use Cold Compresses  - Please use polytrim every 4 hours- these are antibacterial eye drops but will also help with dryness/irritation  Please return if you develop eye pain, changes in vision, symptoms not improving in 1-2 weeks.  Rib Pain: Chest Xray normal, please use anti-inflammatories consistently over the next week for pain/swelling. You may take up to 800 mg Ibuprofen every 8 hours with food. You may supplement Ibuprofen with Tylenol [phone] mg every 8 hours.   Please return if you develop worsening pain, difficulty breathing, changing symptoms.

## 2017-11-08 ENCOUNTER — Encounter (HOSPITAL_COMMUNITY): Payer: Self-pay | Admitting: Emergency Medicine

## 2017-11-08 ENCOUNTER — Ambulatory Visit (HOSPITAL_COMMUNITY)
Admission: EM | Admit: 2017-11-08 | Discharge: 2017-11-08 | Disposition: A | Discharge status: Home / Self Care | Payer: 59 | Attending: Family Medicine | Admitting: Family Medicine

## 2017-11-08 DIAGNOSIS — M545 Low back pain, unspecified: Secondary | ICD-10-CM

## 2017-11-08 MED ORDER — CYCLOBENZAPRINE HCL 5 MG PO TABS: 5.0000 mg | ORAL_TABLET | Freq: Every evening | ORAL | 0 refills | Status: DC | PRN

## 2017-11-08 MED ORDER — KETOROLAC TROMETHAMINE 30 MG/ML IJ SOLN
30.0000 mg | Freq: Once | INTRAMUSCULAR | Status: AC
Start: 2017-11-08 — End: 2017-11-08
  Administered 2017-11-08: 30 mg via INTRAMUSCULAR

## 2017-11-08 MED ORDER — PREDNISONE 50 MG PO TABS: 50.0000 mg | ORAL_TABLET | Freq: Every day | ORAL | 0 refills | Status: DC

## 2017-11-08 MED ORDER — KETOROLAC TROMETHAMINE 30 MG/ML IJ SOLN
INTRAMUSCULAR | Status: AC
  Filled 2017-11-08: qty 1

## 2017-11-08 NOTE — Discharge Instructions (Addendum)
Toradol injection in office today. Prednisone as directed. Flexeril as needed at night. Flexeril can make you drowsy, so do not take if you are going to drive, operate heavy machinery, or make important decisions. Ice/heat compresses as needed. Follow up with orthopedics for further evaluation if symptoms not improving. If experience numbness/tingling of the inner thighs, loss of bladder or bowel control, go to the emergency department for evaluation.   As discussed, you are taking over the max dose of ibuprofen and aleve. Please refrain from these medicines for the next 4-5 days. When restarting, ibuprofen 800mg  three times a day (2400mg /day), naproxen 440mg  twice a day (1000mg /day) is the dosage that will be safe for you.  Exam consistent with viral illness. Prednisone will help with sinus pressure. You can use over the counter flonase for further relief.

## 2017-11-08 NOTE — ED Triage Notes (Signed)
Pt c/o mid lower back pain x3 weeks, getting worse. Pain worse with any movement.

## 2017-11-08 NOTE — ED Provider Notes (Signed)
[facility]    CSN: [id number] Arrival date & time: 11/08/17  1053     History   Chief Complaint Chief Complaint  Patient presents with  . Back Pain    HPI Tony Robinson is a 42 y.o. male.   42 year old male comes in for 3-week history of back pain.  Denies injury/trauma.  States pain first started as intermittent, but has been gradually worsening and becoming more constant throughout the weeks.  Pain is to the lower midline, dull and sharp pains.  Movement makes the pain worse.  Pain can radiate to bilateral back into the buttocks.  States area can be tingling without numbness.  Denies saddle anesthesia, loss of bladder or bowel control.  Has been taking Aleve, ibuprofen, Tylenol without relief.  Has also had URI symptoms.  Nasal congestion, sore throat, sinus pressure.  Denies fever, chills, night sweats.  Has nausea without vomiting.  Still eating and drinking without difficulty.  Denies abdominal pain, diarrhea.  Positive sick contact.     History reviewed. No pertinent past medical history.  Patient Active Problem List   Diagnosis Date Noted  . Ileitis 04/24/2015  . Enteritis 04/24/2015  . STREPTOCOCCAL PHARYNGITIS 05/01/2007  . HEMORRHOIDS 11/03/2005    History reviewed. No pertinent surgical history.     Home Medications    Prior to Admission medications   Medication Sig Start Date End Date Taking? Authorizing Provider  cyclobenzaprine (FLEXERIL) 5 MG tablet Take 1-2 tablets (5-10 mg total) by mouth at bedtime as needed for muscle spasms. 11/08/17   Cathie Hoops, Amy V, PA-C  predniSONE (DELTASONE) 50 MG tablet Take 1 tablet (50 mg total) by mouth daily. 11/08/17   Belinda Fisher, PA-C    Family History Family History  Problem Relation Age of Onset  . Diabetes Mellitus II Mother     Social History Social History   Tobacco Use  . Smoking status: Current Every Day Smoker    Packs/day: 0.50    Types: Cigarettes  Substance Use Topics  . Alcohol use: Yes   . Drug use: No     Allergies   Patient has no known allergies.   Review of Systems Review of Systems  Reason unable to perform ROS: See HPI as above.     Physical Exam Triage Vital Signs ED Triage Vitals  Enc Vitals Group     BP 11/08/17 1148 122/86     Pulse Rate 11/08/17 1148 88     Resp 11/08/17 1148 16     Temp 11/08/17 1148 98.1 F (36.7 C)     Temp Source 11/08/17 1148 Oral     SpO2 11/08/17 1148 100 %     Weight --      Height --      Head Circumference --      Peak Flow --      Pain Score 11/08/17 1150 3     Pain Loc --      Pain Edu? --      Excl. in GC? --    No data found.  Updated Vital Signs BP 122/86 (BP Location: Left Arm)   Pulse 88   Temp 98.1 F (36.7 C) (Oral)   Resp 16   SpO2 100%   Physical Exam  Constitutional: He is oriented to person, place, and time. He appears well-developed and well-nourished. No distress.  HENT:  Head: Normocephalic and atraumatic.  Right Ear: Tympanic membrane, external ear and ear canal normal. Tympanic membrane is  not erythematous and not bulging.  Left Ear: Tympanic membrane, external ear and ear canal normal. Tympanic membrane is not erythematous and not bulging.  Nose: Right sinus exhibits maxillary sinus tenderness and frontal sinus tenderness. Left sinus exhibits maxillary sinus tenderness and frontal sinus tenderness.  Mouth/Throat: Uvula is midline, oropharynx is clear and moist and mucous membranes are normal.  Eyes: Pupils are equal, round, and reactive to light. Conjunctivae are normal.  Neck: Normal range of motion. Neck supple.  Cardiovascular: Normal rate, regular rhythm and normal heart sounds. Exam reveals no gallop and no friction rub.  No murmur heard. Pulmonary/Chest: Effort normal and breath sounds normal. He has no decreased breath sounds. He has no wheezes. He has no rhonchi. He has no rales.  Musculoskeletal:  Noted tenderness to palpation of spinous processes.  Tenderness to palpation of  bilateral paraspinal muscles.  Full range of motion, but with pain.  Strength normal and equal bilaterally.  Sensation intact and equal bilaterally.  Negative straight leg raise.  Lymphadenopathy:    He has no cervical adenopathy.  Neurological: He is alert and oriented to person, place, and time.  Skin: Skin is warm and dry.  Psychiatric: He has a normal mood and affect. His behavior is normal. Judgment normal.     UC Treatments / Results  Labs (all labs ordered are listed, but only abnormal results are displayed) Labs Reviewed - No data to display  EKG None  Radiology No results found.  Procedures Procedures (including critical care time)  Medications Ordered in UC Medications  ketorolac (TORADOL) 30 MG/ML injection 30 mg (30 mg Intramuscular Given 11/08/17 1219)    Initial Impression / Assessment and Plan / UC Course  I have reviewed the triage vital signs and the nursing notes.  Pertinent labs & imaging results that were available during my care of the patient were reviewed by me and considered in my medical decision making (see chart for details).    Toradol in office today. Prednisone as directed. Muscle relaxant as needed. Ice/heat compresses. Return precautions given.   History and exam consistent with viral URI.  Continue OTC medications.  Can add Flonase.  Return precautions given.  Patient expresses understanding and agrees to plan.  Final Clinical Impressions(s) / UC Diagnoses   Final diagnoses:  Acute midline low back pain without sciatica    ED Prescriptions    Medication Sig Dispense Auth. Provider   predniSONE (DELTASONE) 50 MG tablet Take 1 tablet (50 mg total) by mouth daily. 5 tablet Yu, Amy V, PA-C   cyclobenzaprine (FLEXERIL) 5 MG tablet Take 1-2 tablets (5-10 mg total) by mouth at bedtime as needed for muscle spasms. 15 tablet Threasa Alpha, [place] 11/08/17 1223

## 2019-10-02 ENCOUNTER — Other Ambulatory Visit: Payer: 59

## 2019-10-02 ENCOUNTER — Other Ambulatory Visit: Payer: Self-pay

## 2019-10-02 DIAGNOSIS — Z20822 Contact with and (suspected) exposure to covid-19: Secondary | ICD-10-CM

## 2019-10-04 LAB — NOVEL CORONAVIRUS, NAA: SARS-CoV-2, NAA: NOT DETECTED

## 2019-10-04 LAB — SARS-COV-2, NAA 2 DAY TAT

## 2019-10-08 ENCOUNTER — Other Ambulatory Visit: Payer: 59

## 2019-11-19 ENCOUNTER — Other Ambulatory Visit: Payer: 59

## 2019-11-19 DIAGNOSIS — Z20822 Contact with and (suspected) exposure to covid-19: Secondary | ICD-10-CM

## 2019-11-20 LAB — NOVEL CORONAVIRUS, NAA: SARS-CoV-2, NAA: NOT DETECTED

## 2019-11-20 LAB — SARS-COV-2, NAA 2 DAY TAT

## 2020-01-13 ENCOUNTER — Ambulatory Visit (HOSPITAL_COMMUNITY)
Admission: EM | Admit: 2020-01-13 | Discharge: 2020-01-13 | Disposition: A | Discharge status: Home / Self Care | Payer: 59

## 2020-01-13 ENCOUNTER — Encounter (HOSPITAL_COMMUNITY): Payer: Self-pay | Admitting: Emergency Medicine

## 2020-01-13 ENCOUNTER — Other Ambulatory Visit: Payer: Self-pay

## 2020-01-13 ENCOUNTER — Other Ambulatory Visit: Payer: 59

## 2020-01-13 DIAGNOSIS — L03011 Cellulitis of right finger: Secondary | ICD-10-CM | POA: Diagnosis not present

## 2020-01-13 DIAGNOSIS — Z20822 Contact with and (suspected) exposure to covid-19: Secondary | ICD-10-CM

## 2020-01-13 MED ORDER — AMOXICILLIN-POT CLAVULANATE 875-125 MG PO TABS: 1.0000 | ORAL_TABLET | Freq: Two times a day (BID) | ORAL | 0 refills | Status: DC

## 2020-01-13 NOTE — ED Provider Notes (Signed)
[facility]    CSN: [id number] Arrival date & time: 01/13/20  0623      History   Chief Complaint Chief Complaint  Patient presents with  . Hand Problem    HPI Tony Robinson is a 44 y.o. male.   Patient is a 44 year old male who presents today with worsening right thumb swelling, pain.  This started Friday.  Was started on Bactrim and has not improved.  Has been doing warm soaks and massage to the thumb without any relief.  No drainage.  No fever.     History reviewed. No pertinent past medical history.  Patient Active Problem List   Diagnosis Date Noted  . Ileitis 04/24/2015  . Enteritis 04/24/2015  . STREPTOCOCCAL PHARYNGITIS 05/01/2007  . HEMORRHOIDS 11/03/2005    History reviewed. No pertinent surgical history.     Home Medications    Prior to Admission medications   Medication Sig Start Date End Date Taking? Authorizing Provider  sulfamethoxazole-trimethoprim (BACTRIM) 200-40 MG/5ML suspension Take by mouth 2 (two) times daily.   Yes [provider]  amoxicillin-clavulanate (AUGMENTIN) 875-125 MG tablet Take 1 tablet by mouth every 12 (twelve) hours. 01/13/20   Janace Aris, NP    Family History Family History  Problem Relation Age of Onset  . Diabetes Mellitus II Mother     Social History Social History   Tobacco Use  . Smoking status: Current Every Day Smoker    Packs/day: 0.50    Types: Cigarettes  . Smokeless tobacco: Never Used  Substance Use Topics  . Alcohol use: Yes  . Drug use: No     Allergies   Patient has no known allergies.   Review of Systems Review of Systems   Physical Exam Triage Vital Signs ED Triage Vitals  Enc Vitals Group     BP 01/13/20 1025 120/86     Pulse Rate 01/13/20 1025 70     Resp 01/13/20 1025 12     Temp 01/13/20 1025 97.9 F (36.6 C)     Temp Source 01/13/20 1025 Oral     SpO2 01/13/20 1025 99 %     Weight 01/13/20 1023 175 lb (79.4 kg)     Height 01/13/20 1023 6\' 2"   (1.88 m)     Head Circumference --      Peak Flow --      Pain Score 01/13/20 1023 9     Pain Loc --      Pain Edu? --      Excl. in GC? --    No data found.  Updated Vital Signs BP 120/86 (BP Location: Left Arm)   Pulse 70   Temp 97.9 F (36.6 C) (Oral)   Resp 12   Ht 6\' 2"  (1.88 m)   Wt 175 lb (79.4 kg)   SpO2 99%   BMI 22.47 kg/m   Visual Acuity Right Eye Distance:   Left Eye Distance:   Bilateral Distance:    Right Eye Near:   Left Eye Near:    Bilateral Near:     Physical Exam Vitals and nursing note reviewed.  Constitutional:      Appearance: Normal appearance.  HENT:     Head: Normocephalic and atraumatic.     Nose: Nose normal.  Eyes:     Conjunctiva/sclera: Conjunctivae normal.  Pulmonary:     Effort: Pulmonary effort is normal.  Musculoskeletal:        General: Normal range of motion.  Cervical back: Normal range of motion.  Skin:    General: Skin is warm and dry.     Comments: Paronychia to right thumb with pretty moderate erythema, swelling extending into the pad of thumb.  Neurological:     Mental Status: He is alert.  Psychiatric:        Mood and Affect: Mood normal.      UC Treatments / Results  Labs (all labs ordered are listed, but only abnormal results are displayed) Labs Reviewed - No data to display  EKG   Radiology No results found.  Procedures Incision and Drainage  Date/Time: 01/13/2020 4:43 PM Performed by: Janace Aris, NP Authorized by: Janace Aris, NP   Consent:    Consent obtained:  Verbal   Consent given by:  Patient   Risks, benefits, and alternatives were discussed: yes     Risks discussed:  Bleeding, incomplete drainage and pain   Alternatives discussed:  No treatment Location:    Indications for incision and drainage: Paronychia.   Location:  Upper extremity   Upper extremity location:  Finger   Finger location:  R thumb Pre-procedure details:    Skin preparation:  Povidone-iodine Anesthesia:     Anesthesia method:  Nerve block   Block needle gauge:  27 G   Block anesthetic:  Lidocaine 2% w/o epi   Block injection procedure:  Anatomic landmarks identified, introduced needle and negative aspiration for blood   Block outcome:  Anesthesia achieved Procedure type:    Complexity:  Complex Procedure details:    Incision types:  Stab incision   Incision depth:  Subungual   Drainage:  Purulent   Drainage amount:  Copious   Wound treatment:  Wound left open   Packing materials:  None Post-procedure details:    Procedure completion:  Tolerated   (including critical care time)  Medications Ordered in UC Medications - No data to display  Initial Impression / Assessment and Plan / UC Course  I have reviewed the triage vital signs and the nursing notes.  Pertinent labs & imaging results that were available during my care of the patient were reviewed by me and considered in my medical decision making (see chart for details).     Paronychia of the right thumb. Drained here in clinic Patient tolerated well.  We will have him continue the Bactrim and placing on amoxicillin for better coverage. Continue warm soaks and massaging finger.  Ibuprofen for pain as needed. Follow up as needed for continued or worsening symptoms  Final Clinical Impressions(s) / UC Diagnoses   Final diagnoses:  Paronychia of right thumb     Discharge Instructions     Continue the Bactrim.  Take the amoxicillin as prescribed. Continue warm soaks to the finger and massage to promote more drainage.  Keep wrapped Follow up as needed for continued or worsening symptoms     ED Prescriptions    Medication Sig Dispense Auth. Provider   amoxicillin-clavulanate (AUGMENTIN) 875-125 MG tablet Take 1 tablet by mouth every 12 (twelve) hours. 14 tablet Davianna Deutschman A, NP     PDMP not reviewed this encounter.   Janace Aris, NP 01/13/20 1649

## 2020-01-13 NOTE — Discharge Instructions (Signed)
Continue the Bactrim.  Take the amoxicillin as prescribed. Continue warm soaks to the finger and massage to promote more drainage.  Keep wrapped Follow up as needed for continued or worsening symptoms

## 2020-01-13 NOTE — ED Triage Notes (Signed)
Patient c/o RT hand swelling  (finger, thumb) since Friday.   Patient denies fever.   Patient endorses pain.   Patient denies any drainage.   Patient was placed on Septra by another provider. Patient was advised to see provider if symptoms didn't improve.

## 2020-01-15 LAB — SARS-COV-2, NAA 2 DAY TAT

## 2020-01-15 LAB — NOVEL CORONAVIRUS, NAA: SARS-CoV-2, NAA: NOT DETECTED

## 2020-08-15 ENCOUNTER — Other Ambulatory Visit: Payer: Self-pay

## 2020-08-15 ENCOUNTER — Emergency Department (HOSPITAL_BASED_OUTPATIENT_CLINIC_OR_DEPARTMENT_OTHER)
Admission: EM | Admit: 2020-08-15 | Discharge: 2020-08-15 | Disposition: A | Discharge status: Home / Self Care | Payer: 59 | Attending: Emergency Medicine | Admitting: Emergency Medicine

## 2020-08-15 ENCOUNTER — Encounter (HOSPITAL_BASED_OUTPATIENT_CLINIC_OR_DEPARTMENT_OTHER): Payer: Self-pay | Admitting: Urology

## 2020-08-15 ENCOUNTER — Emergency Department (HOSPITAL_BASED_OUTPATIENT_CLINIC_OR_DEPARTMENT_OTHER): Payer: 59 | Admitting: Radiology

## 2020-08-15 DIAGNOSIS — R0602 Shortness of breath: Secondary | ICD-10-CM | POA: Diagnosis not present

## 2020-08-15 DIAGNOSIS — R42 Dizziness and giddiness: Secondary | ICD-10-CM | POA: Insufficient documentation

## 2020-08-15 DIAGNOSIS — R079 Chest pain, unspecified: Secondary | ICD-10-CM | POA: Insufficient documentation

## 2020-08-15 DIAGNOSIS — F17210 Nicotine dependence, cigarettes, uncomplicated: Secondary | ICD-10-CM | POA: Diagnosis not present

## 2020-08-15 DIAGNOSIS — R5383 Other fatigue: Secondary | ICD-10-CM | POA: Insufficient documentation

## 2020-08-15 LAB — BASIC METABOLIC PANEL
Anion gap: 12 (ref 5–15)
BUN: 19 mg/dL (ref 6–20)
CO2: 23 mmol/L (ref 22–32)
Calcium: 9.1 mg/dL (ref 8.9–10.3)
Chloride: 104 mmol/L (ref 98–111)
Creatinine, Ser: 1.29 mg/dL — ABNORMAL HIGH (ref 0.61–1.24)
GFR, Estimated: >60 mL/min (ref >60)
Glucose, Bld: 86 mg/dL (ref 70–99)
Potassium: 3.9 mmol/L (ref 3.5–5.1)
Sodium: 139 mmol/L (ref 135–145)

## 2020-08-15 LAB — D-DIMER, QUANTITATIVE: D-Dimer, Quant: 0.32 ug/mL-FEU (ref 0.00–0.50)

## 2020-08-15 LAB — CBC
HCT: 41.4 % (ref 39.0–52.0)
Hemoglobin: 14.7 g/dL (ref 13.0–17.0)
MCH: 31.7 pg (ref 26.0–34.0)
MCHC: 35.5 g/dL (ref 30.0–36.0)
MCV: 89.2 fL (ref 80.0–100.0)
Platelets: 227 10*3/uL (ref 150–400)
RBC: 4.64 MIL/uL (ref 4.22–5.81)
RDW: 13.1 % (ref 11.5–15.5)
WBC: 8.9 10*3/uL (ref 4.0–10.5)
nRBC: 0 % (ref 0.0–0.2)

## 2020-08-15 LAB — TROPONIN I (HIGH SENSITIVITY)
Troponin I (High Sensitivity): 3 ng/L (ref <18)
Troponin I (High Sensitivity): 3 ng/L (ref <18)

## 2020-08-15 MED ORDER — KETOROLAC TROMETHAMINE 30 MG/ML IJ SOLN
30.0000 mg | Freq: Once | INTRAMUSCULAR | Status: AC
  Administered 2020-08-15: 30 mg via INTRAVENOUS
  Filled 2020-08-15: qty 1

## 2020-08-15 NOTE — ED Triage Notes (Signed)
Left sided chest pain started today at 1045 while playing golf, reports SOB and dizziness.  Pain with breathing

## 2020-08-15 NOTE — ED Provider Notes (Signed)
[facility] EMERGENCY DEPT Provider Note   CSN: [id number] Arrival date & time: 08/15/20  1504     History Chief Complaint  Patient presents with   Chest Pain    Tony Robinson is a 45 y.o. male.  HPI      Was golfing, felt some chest pain left side around 9th hole Felt fatigued but also hot out At end of game, began to have more severe pain with associated shortness of breath  Deep breaths sharp pain, otherwise felt like a pulled muscle At end of game it felt like chest was caving in, severe pressure, like with every breath someone pushing on chest.  Some radiation to shoulder.  At it's worst was 8/10 and right now is not hurting but with deep breaths is a 4/10, between 1 and 130 was the worst. Worse when sitting forward  No nausea, vomiting. Difficult to differentiate between sweating from 90 degree heat  Dizzy, lightheaded but also outside in heat No leg pain or swelling, recent immobilization or surgeries, long trips  Smoking cigarettes, etoh, no other drugs   Hyperlipidemia, has not been to physician in 7 years and not on medications No fam hx of early heart disease   History reviewed. No pertinent past medical history.  Patient Active Problem List   Diagnosis Date Noted   Ileitis 04/24/2015   Enteritis 04/24/2015   STREPTOCOCCAL PHARYNGITIS 05/01/2007   HEMORRHOIDS 11/03/2005    History reviewed. No pertinent surgical history.     Family History  Problem Relation Age of Onset   Diabetes Mellitus II Mother     Social History   Tobacco Use   Smoking status: Every Day    Packs/day: 0.50    Types: Cigarettes   Smokeless tobacco: Never  Substance Use Topics   Alcohol use: Yes    Comment: daily   Drug use: No    Home Medications Prior to Admission medications   Medication Sig Start Date End Date Taking? Authorizing Provider  amoxicillin-clavulanate (AUGMENTIN) 875-125 MG tablet Take 1 tablet by mouth every 12 (twelve) hours.  01/13/20   Dahlia Byes A, NP  sulfamethoxazole-trimethoprim (BACTRIM) 200-40 MG/5ML suspension Take by mouth 2 (two) times daily.    [provider]    Allergies    Patient has no known allergies.  Review of Systems   Review of Systems  Constitutional:  Negative for fever.  Respiratory:  Positive for shortness of breath. Negative for cough.   Cardiovascular:  Positive for chest pain. Negative for leg swelling.  Gastrointestinal:  Negative for abdominal pain, nausea and vomiting.  Musculoskeletal:  Negative for back pain.  Skin:  Negative for rash.  Neurological:  Positive for light-headedness. Negative for headaches.   Physical Exam Updated Vital Signs BP 122/80   Pulse 79   Temp 98.3 F (36.8 C)   Resp 19   Ht 6\' 2"  (1.88 m)   Wt 79.4 kg   SpO2 97%   BMI 22.47 kg/m   Physical Exam Vitals and nursing note reviewed.  Constitutional:      General: He is not in acute distress.    Appearance: He is well-developed. He is not diaphoretic.  HENT:     Head: Normocephalic and atraumatic.  Eyes:     Conjunctiva/sclera: Conjunctivae normal.  Cardiovascular:     Rate and Rhythm: Normal rate and regular rhythm.     Heart sounds: Normal heart sounds. No murmur heard.   No friction rub. No gallop.  Pulmonary:  Effort: Pulmonary effort is normal. No respiratory distress.     Breath sounds: Normal breath sounds. No wheezing or rales.  Abdominal:     General: There is no distension.     Palpations: Abdomen is soft.     Tenderness: There is no abdominal tenderness. There is no guarding.  Musculoskeletal:     Cervical back: Normal range of motion.  Skin:    General: Skin is warm and dry.  Neurological:     Mental Status: He is alert and oriented to person, place, and time.    ED Results / Procedures / Treatments   Labs (all labs ordered are listed, but only abnormal results are displayed) Labs Reviewed  BASIC METABOLIC PANEL - Abnormal; Notable for the following  components:      Result Value   Creatinine, Ser 1.29 (*)    All other components within normal limits  CBC  D-DIMER, QUANTITATIVE  TROPONIN I (HIGH SENSITIVITY)  TROPONIN I (HIGH SENSITIVITY)    EKG EKG Interpretation  Date/Time:  Saturday August 15 2020 18:46:53 EDT Ventricular Rate:  78 PR Interval:  151 QRS Duration: 100 QT Interval:  383 QTC Calculation: 437 R Axis:   94 Text Interpretation: Sinus rhythm Borderline right axis deviation No significant change since last tracing Confirmed by Alvira Monday ([id number]) on 08/16/2020 10:45:19 AM  Radiology DG Chest 2 View  Result Date: 08/15/2020 CLINICAL DATA:  Left-sided chest pain beginning today. EXAM: CHEST - 2 VIEW COMPARISON:  04/10/2017. FINDINGS: Cardiac silhouette is normal in size. Normal mediastinal and hilar contours. Stable scarring in the upper lobes most evident at the apices. Lungs otherwise clear. No pleural effusion or pneumothorax. Skeletal structures are unremarkable. IMPRESSION: No active cardiopulmonary disease. Electronically Signed   By: Amie Portland M.D.   On: 08/15/2020 15:55    Procedures Procedures   Medications Ordered in ED Medications  ketorolac (TORADOL) 30 MG/ML injection 30 mg (30 mg Intravenous Given 08/15/20 1934)    ED Course  I have reviewed the triage vital signs and the nursing notes.  Pertinent labs & imaging results that were available during my care of the patient were reviewed by me and considered in my medical decision making (see chart for details).    MDM Rules/Calculators/A&P                           45 year old male with history of smoking and untreated hyperlipidemia presents with concern of chest pain. Differential diagnosis for chest pain includes pulmonary embolus, dissection, pneumothorax, pneumonia, ACS, myocarditis, pericarditis.  EKG was done and evaluate by me and showed no acute ST changes and no signs of pericarditis. Chest x-ray was done and evaluated by me and  radiology and showed no sign of pneumonia or pneumothorax. Patient is low risk [place] with negative ddimer and have low suspicion for PE.  Patient is low risk HEART score and had delta troponins which were both negative.  Do not feel history or exam are consistent with aortic dissection. No sign of intraabdominal abnormalities by hx or exam. Not worse with eating or consistent with gastritis. Possible MSK etiology. Recommend NSAIDs, PCP follow up and given number for Cardiology.   Final Clinical Impression(s) / ED Diagnoses Final diagnoses:  Chest pain, unspecified type    Rx / DC Orders ED Discharge Orders     None        Alvira Monday, MD 08/16/20 1047

## 2020-08-15 NOTE — ED Notes (Signed)
Pt verbalizes understanding of discharge instructions. Opportunity for questioning and answers were provided. Armand removed by staff, pt discharged from ED to home. Educated to f/u with cardiology.

## 2020-09-02 ENCOUNTER — Emergency Department (HOSPITAL_BASED_OUTPATIENT_CLINIC_OR_DEPARTMENT_OTHER): Payer: 59

## 2020-09-02 ENCOUNTER — Other Ambulatory Visit: Payer: Self-pay

## 2020-09-02 ENCOUNTER — Encounter (HOSPITAL_BASED_OUTPATIENT_CLINIC_OR_DEPARTMENT_OTHER): Payer: Self-pay | Admitting: *Deleted

## 2020-09-02 ENCOUNTER — Emergency Department (HOSPITAL_BASED_OUTPATIENT_CLINIC_OR_DEPARTMENT_OTHER): Payer: 59 | Admitting: Radiology

## 2020-09-02 ENCOUNTER — Inpatient Hospital Stay (HOSPITAL_BASED_OUTPATIENT_CLINIC_OR_DEPARTMENT_OTHER)
Admission: EM | Admit: 2020-09-02 | Discharge: 2020-09-11 | DRG: 165 | Disposition: A | Discharge status: Home / Self Care | Payer: 59 | Attending: Thoracic Surgery (Cardiothoracic Vascular Surgery) | Admitting: Thoracic Surgery (Cardiothoracic Vascular Surgery)

## 2020-09-02 DIAGNOSIS — Z4682 Encounter for fitting and adjustment of non-vascular catheter: Secondary | ICD-10-CM

## 2020-09-02 DIAGNOSIS — Z20822 Contact with and (suspected) exposure to covid-19: Secondary | ICD-10-CM | POA: Diagnosis present

## 2020-09-02 DIAGNOSIS — F17210 Nicotine dependence, cigarettes, uncomplicated: Secondary | ICD-10-CM | POA: Diagnosis present

## 2020-09-02 DIAGNOSIS — R001 Bradycardia, unspecified: Secondary | ICD-10-CM | POA: Diagnosis present

## 2020-09-02 DIAGNOSIS — Z9889 Other specified postprocedural states: Secondary | ICD-10-CM

## 2020-09-02 DIAGNOSIS — Z716 Tobacco abuse counseling: Secondary | ICD-10-CM

## 2020-09-02 DIAGNOSIS — J9311 Primary spontaneous pneumothorax: Principal | ICD-10-CM | POA: Diagnosis present

## 2020-09-02 DIAGNOSIS — J939 Pneumothorax, unspecified: Secondary | ICD-10-CM

## 2020-09-02 DIAGNOSIS — J9383 Other pneumothorax: Secondary | ICD-10-CM

## 2020-09-02 DIAGNOSIS — J9382 Other air leak: Secondary | ICD-10-CM | POA: Diagnosis not present

## 2020-09-02 DIAGNOSIS — Z9689 Presence of other specified functional implants: Secondary | ICD-10-CM

## 2020-09-02 DIAGNOSIS — J439 Emphysema, unspecified: Secondary | ICD-10-CM

## 2020-09-02 LAB — CBC
HCT: 44.2 % (ref 39.0–52.0)
Hemoglobin: 15.2 g/dL (ref 13.0–17.0)
MCH: 30.8 pg (ref 26.0–34.0)
MCHC: 34.4 g/dL (ref 30.0–36.0)
MCV: 89.5 fL (ref 80.0–100.0)
Platelets: 230 10*3/uL (ref 150–400)
RBC: 4.94 MIL/uL (ref 4.22–5.81)
RDW: 13.3 % (ref 11.5–15.5)
WBC: 8 10*3/uL (ref 4.0–10.5)
nRBC: 0 % (ref 0.0–0.2)

## 2020-09-02 LAB — BASIC METABOLIC PANEL
Anion gap: 10 (ref 5–15)
BUN: 13 mg/dL (ref 6–20)
CO2: 26 mmol/L (ref 22–32)
Calcium: 9.1 mg/dL (ref 8.9–10.3)
Chloride: 103 mmol/L (ref 98–111)
Creatinine, Ser: 1 mg/dL (ref 0.61–1.24)
GFR, Estimated: >60 mL/min (ref >60)
Glucose, Bld: 102 mg/dL — ABNORMAL HIGH (ref 70–99)
Potassium: 3.9 mmol/L (ref 3.5–5.1)
Sodium: 139 mmol/L (ref 135–145)

## 2020-09-02 LAB — TROPONIN I (HIGH SENSITIVITY): Troponin I (High Sensitivity): 2 ng/L (ref <18)

## 2020-09-02 LAB — RESP PANEL BY RT-PCR (FLU A&B, COVID) ARPGX2
Influenza A by PCR: NEGATIVE
Influenza B by PCR: NEGATIVE
SARS Coronavirus 2 by RT PCR: NEGATIVE

## 2020-09-02 MED ORDER — HYDROMORPHONE HCL 1 MG/ML IJ SOLN
1.0000 mg | INTRAMUSCULAR | Status: DC | PRN
  Administered 2020-09-02 – 2020-09-11 (×13): 1 mg via INTRAVENOUS
  Filled 2020-09-02 (×15): qty 1

## 2020-09-02 MED ORDER — HYDROMORPHONE HCL 1 MG/ML IJ SOLN
0.5000 mg | Freq: Once | INTRAMUSCULAR | Status: AC
  Administered 2020-09-02: 0.5 mg via INTRAVENOUS

## 2020-09-02 MED ORDER — LORAZEPAM 2 MG/ML IJ SOLN
1.0000 mg | Freq: Once | INTRAMUSCULAR | Status: AC
  Administered 2020-09-02: 1 mg via INTRAVENOUS

## 2020-09-02 MED ORDER — ENOXAPARIN SODIUM 40 MG/0.4ML IJ SOSY
40.0000 mg | PREFILLED_SYRINGE | INTRAMUSCULAR | Status: DC
  Administered 2020-09-02 – 2020-09-07 (×6): 40 mg via SUBCUTANEOUS
  Filled 2020-09-02 (×5): qty 0.4

## 2020-09-02 MED ORDER — OXYCODONE HCL 5 MG PO TABS
5.0000 mg | ORAL_TABLET | ORAL | Status: DC | PRN
  Administered 2020-09-03 – 2020-09-09 (×14): 5 mg via ORAL
  Filled 2020-09-02 (×14): qty 1

## 2020-09-02 MED ORDER — LORAZEPAM 2 MG/ML IJ SOLN
0.5000 mg | Freq: Once | INTRAMUSCULAR | Status: DC
  Filled 2020-09-02: qty 1

## 2020-09-02 MED ORDER — LORAZEPAM 2 MG/ML IJ SOLN
1.0000 mg | INTRAMUSCULAR | Status: DC | PRN
  Administered 2020-09-02: 1 mg via INTRAVENOUS
  Filled 2020-09-02: qty 1

## 2020-09-02 MED ORDER — ZOLPIDEM TARTRATE 5 MG PO TABS
5.0000 mg | ORAL_TABLET | Freq: Every evening | ORAL | Status: DC | PRN
  Administered 2020-09-03 – 2020-09-10 (×5): 5 mg via ORAL
  Filled 2020-09-02 (×6): qty 1

## 2020-09-02 MED ORDER — KETOROLAC TROMETHAMINE 15 MG/ML IJ SOLN
15.0000 mg | Freq: Four times a day (QID) | INTRAMUSCULAR | Status: AC
Start: 2020-09-02 — End: 2020-09-04
  Administered 2020-09-02 – 2020-09-04 (×7): 15 mg via INTRAVENOUS
  Filled 2020-09-02 (×7): qty 1

## 2020-09-02 MED ORDER — HYDROMORPHONE HCL 1 MG/ML IJ SOLN
0.5000 mg | Freq: Once | INTRAMUSCULAR | Status: DC
  Filled 2020-09-02: qty 1

## 2020-09-02 MED ORDER — SODIUM CHLORIDE 0.45 % IV SOLN: INTRAVENOUS | Status: DC

## 2020-09-02 MED ORDER — ACETAMINOPHEN 325 MG PO TABS
650.0000 mg | ORAL_TABLET | Freq: Four times a day (QID) | ORAL | Status: DC | PRN
  Administered 2020-09-03 – 2020-09-04 (×2): 650 mg via ORAL
  Filled 2020-09-02 (×2): qty 2

## 2020-09-02 NOTE — ED Triage Notes (Addendum)
Right chest pain started after playing with the dog an hour ago.  Complaint of being dizzy and right jaw pain radiating to his right arm with tingling sensation on his way here.

## 2020-09-02 NOTE — ED Notes (Signed)
Patient signed Procedural Consent for Chest Tube Insertion Willingly. This RN witnessed Signature as Witness.

## 2020-09-02 NOTE — ED Notes (Addendum)
Patient comfortable at this time. Patient placed on 15L NRB by Megan, RT. Call Bell within Reach. Staff will continue to monitor for Changes in Patient Condition.

## 2020-09-02 NOTE — ED Provider Notes (Addendum)
[facility]ENC[facility] DEPT Provider Note   CSN: [id number] Arrival date & time: 09/02/20  1737     History Chief Complaint  Patient presents with   Dizziness   Chest Pain    Tony Robinson is a 45 y.o. male.  HPI Patient reports that about 4:56 in the afternoon he was playing with his dog and down on the floor.  He reports he quite suddenly got a sharp pain in the right side of the chest.  He thought maybe he had pulled a muscle because he had done that a few weeks ago on the other side of the chest.  He tried to wait it out for about an hour but was feeling short of breath and the pain was pretty intense radiating up towards his jaw and arm as well.  He decided to come to the emergency department for further evaluation.  Patient denies any prior similar episode.  He has no past medical history.  Patient does have long smoking history.    History reviewed. No pertinent past medical history.  Patient Active Problem List   Diagnosis Date Noted   Spontaneous pneumothorax 09/02/2020   Ileitis 04/24/2015   Enteritis 04/24/2015   STREPTOCOCCAL PHARYNGITIS 05/01/2007   HEMORRHOIDS 11/03/2005    History reviewed. No pertinent surgical history.     Family History  Problem Relation Age of Onset   Diabetes Mellitus II Mother     Social History   Tobacco Use   Smoking status: Every Day    Packs/day: 0.50    Types: Cigarettes   Smokeless tobacco: Never  Vaping Use   Vaping Use: Never used  Substance Use Topics   Alcohol use: Yes    Comment: daily   Drug use: No    Home Medications Prior to Admission medications   Not on File    Allergies    Patient has no known allergies.  Review of Systems   Review of Systems 10 systems reviewed negative except as per HPI Physical Exam Updated Vital Signs BP 129/89   Pulse 61   Temp 98.4 F (36.9 C)   Resp 15   Ht 6' 2"  (1.88 m)   Wt 79.4 kg   SpO2 100%   BMI 22.47 kg/m   Physical Exam Constitutional:       Comments: Patient is alert.  Nontoxic.  No respiratory distress at rest.  Appears to be very uncomfortable.  Mental Status is clear.  HENT:     Head: Normocephalic and atraumatic.     Mouth/Throat:     Pharynx: Oropharynx is clear.  Eyes:     Extraocular Movements: Extraocular movements intact.  Cardiovascular:     Rate and Rhythm: Normal rate and regular rhythm.     Heart sounds: Normal heart sounds.  Pulmonary:     Comments: Patient does not have respiratory distress but does have pain with deep inspiration.  Breath sounds slightly soft  on the right.  Breath sounds on the left clear. Abdominal:     General: There is no distension.     Palpations: Abdomen is soft.     Tenderness: There is no abdominal tenderness. There is no guarding.  Musculoskeletal:        General: No swelling or tenderness. Normal range of motion.     Right lower leg: No edema.     Left lower leg: No edema.  Skin:    General: Skin is warm and dry.  Neurological:  General: No focal deficit present.     Mental Status: He is oriented to person, place, and time.     Coordination: Coordination normal.  Psychiatric:        Mood and Affect: Mood normal.    ED Results / Procedures / Treatments   Labs (all labs ordered are listed, but only abnormal results are displayed) Labs Reviewed  BASIC METABOLIC PANEL - Abnormal; Notable for the following components:      Result Value   Glucose, Bld 102 (*)    All other components within normal limits  RESP PANEL BY RT-PCR (FLU A&B, COVID) ARPGX2  CBC  TROPONIN I (HIGH SENSITIVITY)    EKG None   Radiology DG Chest 2 View  Result Date: 09/02/2020 CLINICAL DATA:  Chest pain EXAM: CHEST - 2 VIEW COMPARISON:  08/15/2020 FINDINGS: Interval development of right apical pneumothorax approximately 4 cm in thickness. This was not present previously. There was apical pleuroparenchymal scarring bilaterally seen on the prior study. The right lung appears mildly  hyperinflated with mild mediastinal shift to the left suggesting possible tension pneumothorax. No pleural effusion Left lung remains clear. IMPRESSION: Moderate right apical pneumothorax. Mild mediastinal shift to the left suggesting tension component. These results were called by telephone at the time of interpretation on 09/02/2020 at 6:50 pm to provider [facility] , who verbally acknowledged these results. Electronically Signed   By: Franchot Gallo M.D.   On: 09/02/2020 18:51   CT Chest Wo Contrast  Result Date: 09/02/2020 CLINICAL DATA:  Chest pain while playing with dog stent day, denies injury EXAM: CT CHEST WITHOUT CONTRAST TECHNIQUE: Multidetector CT imaging of the chest was performed following the standard protocol without IV contrast. COMPARISON:  Radiograph 09/02/2020 FINDINGS: Cardiovascular: Limited assessment in the absence of contrast media. Normal heart size. No pericardial effusion. Normal caliber aorta. No hyperdense mural thickening. No periaortic stranding. Central pulmonary arteries are normal caliber. Luminal evaluation precluded. No major venous abnormality. Mediastinum/Nodes: Very mild leftward mediastinal shift. No mediastinal fluid or gas. Normal thyroid gland and thoracic inlet. No acute abnormality of the trachea or esophagus. No worrisome mediastinal or axillary adenopathy. Hilar nodal evaluation is limited in the absence of intravenous contrast media. Lungs/Pleura: Moderate volume right pneumothorax. Multiple apical blebs paraseptal emphysematous changes seen within both lung apices. More focal subpleural thickening of the right lung apex could reflect an area of ruptured bleb providing possible explanation for the pneumothorax. Atelectatic changes in the right lung base. Left lung is otherwise clear. No layering pleural effusion. Upper Abdomen: No acute abnormalities present in the visualized portions of the upper abdomen. Musculoskeletal: No acute osseous abnormality or  suspicious osseous lesion. IMPRESSION: Moderate right pneumothorax. Minimal leftward mediastinal shift, could reflect a tension component. Apical bullous disease/blebs versus paraseptal emphysematous change. Focal thickening towards the periphery of the right lung apex could reflect a ruptured apical bleb as an explanation for pneumothorax without other concerning CT findings or explanatory causes. Electronically Signed   By: Lovena Le M.D.   On: 09/02/2020 20:01   DG Chest Portable 1 View  Result Date: 09/02/2020 CLINICAL DATA:  Post chest tube insertion EXAM: PORTABLE CHEST 1 VIEW COMPARISON:  09/02/2020 FINDINGS: The original chest x-ray appears to demonstrate the chest tube was outside of the pleural space. Additional imaging demonstrate repositioning of the right chest tube in the right pleural space. Continued right pneumothorax, stable or slightly larger than prior study. IMPRESSION: Placement of right chest tube with continued moderate to large right  pneumothorax, stable or slightly increasing in size. Electronically Signed   By: Rolm Baptise M.D.   On: 09/02/2020 23:45    Procedures CHEST TUBE INSERTION  Date/Time: 09/03/2020 12:07 AM Performed by: Charlesetta Shanks, MD Authorized by: Charlesetta Shanks, MD   Consent:    Consent obtained:  Verbal   Consent given by:  Patient   Risks discussed:  Bleeding, damage to surrounding structures, infection, pain, incomplete drainage and nerve damage Pre-procedure details:    Skin preparation:  Chlorhexidine   Preparation: Patient was prepped and draped in the usual sterile fashion   Sedation:    Sedation type:  Anxiolysis Anesthesia:    Anesthesia method:  Local infiltration   Local anesthetic:  Lidocaine 1% w/o epi Procedure details:    Placement location:  R lateral   Scalpel size:  11   Tube size (Fr):  Minicatheter   Ultrasound guidance: no     Tension pneumothorax: no     Tube connected to:  Heimlich valve and water seal   Drainage  characteristics:  Air only   Suture material:  0 silk Post-procedure details:    Post-insertion x-ray findings: tube repositioned     Procedure completion:  Tolerated well, no immediate complications Comments:     Patient was prepped and draped with sterile technique.  Seldinger technique used for insertion of Wayne pigtail catheter.  Upon first procedure good air return to syringe but resistance when putting in the catheter.  Chest x-ray obtained and first catheter confirmed to be on the outside of the chest wall.  Sterile field maintained but new set up used and new drape.  Upon second attempt, wire and catheter threaded without any resistance and chest tube confirmed by x-ray within the thoracic space.  Heimlich valve applied and attached to waterseal.  Patient tolerated procedure well without discomfort or distress. Vital signs remained stable through out.   CRITICAL CARE Performed by: Charlesetta Shanks   Total critical care time: 30 minutes  Critical care time was exclusive of separately billable procedures and treating other patients.  Critical care was necessary to treat or prevent imminent or life-threatening deterioration.  Critical care was time spent personally by me on the following activities: development of treatment plan with patient and/or surrogate as well as nursing, discussions with consultants, evaluation of patient's response to treatment, examination of patient, obtaining history from patient or surrogate, ordering and performing treatments and interventions, ordering and review of laboratory studies, ordering and review of radiographic studies, pulse oximetry and re-evaluation of patient's condition.  Medications Ordered in ED Medications  0.45 % sodium chloride infusion ( Intravenous Stopped 09/03/20 0003)  oxyCODONE (Oxy IR/ROXICODONE) immediate release tablet 5 mg (has no administration in time range)  acetaminophen (TYLENOL) tablet 650 mg (has no administration in time  range)  HYDROmorphone (DILAUDID) injection 1 mg (1 mg Intravenous Given 09/02/20 2216)  ketorolac (TORADOL) 15 MG/ML injection 15 mg (15 mg Intravenous Given 09/02/20 2051)  zolpidem (AMBIEN) tablet 5 mg (has no administration in time range)  enoxaparin (LOVENOX) injection 40 mg (40 mg Subcutaneous Given 09/02/20 2124)  LORazepam (ATIVAN) injection 1 mg (1 mg Intravenous Given 09/02/20 2228)  HYDROmorphone (DILAUDID) injection 0.5 mg (0.5 mg Intravenous Given 09/02/20 2201)  LORazepam (ATIVAN) injection 1 mg (1 mg Intravenous Given 09/02/20 2202)    ED Course  I have reviewed the triage vital signs and the nursing notes.  Pertinent labs & imaging results that were available during my care of the patient were reviewed  by me and considered in my medical decision making (see chart for details).    MDM Rules/Calculators/A&P                           Consult: Dr. Roxan Hockey  CT surgery for admission.  Patient presents with a spontaneous pneumothorax.  He denies any past medical history.  CT scan does show evidence of emphysema as source of spontaneous pneumothorax.  Patient otherwise clinically well in appearance.  His vital signs are stable.  He did not have any respiratory distress.  No physiologic symptoms of a tension pneumothorax. Patient did not have any hypoxia or increased work of breathing at rest. Pigtail chest tube placed.  First attempt chest tube met resistance and xray confirmed it to be tracking subcutaneuos outside chest wall. that  tube removed and new kit used. Sterile field maintained throughout transition to new set up. Second placement shows good position. Pneumonthorax persists.Patient is comfortable on waterseal without respiratory distress, normal HR and BP. Final Clinical Impression(s) / ED Diagnoses Final diagnoses:  Pneumothorax  Spontaneous pneumothorax  Pulmonary emphysema, unspecified emphysema type [facility])    Rx / DC Orders ED Discharge Orders     None         Charlesetta Shanks, MD 09/03/20 [id number]    Charlesetta Shanks, MD 09/03/20 Boone Master, MD 09/03/20 0031

## 2020-09-02 NOTE — ED Notes (Signed)
MD Pfeiffer completed Chest Tube Insertion. Order Placed by Admitting Physician. Patient comfortable at this time. Chest Tube Secured.

## 2020-09-03 ENCOUNTER — Inpatient Hospital Stay (HOSPITAL_COMMUNITY): Payer: 59

## 2020-09-03 DIAGNOSIS — J9311 Primary spontaneous pneumothorax: Secondary | ICD-10-CM | POA: Diagnosis present

## 2020-09-03 DIAGNOSIS — Z20822 Contact with and (suspected) exposure to covid-19: Secondary | ICD-10-CM | POA: Diagnosis present

## 2020-09-03 DIAGNOSIS — Z716 Tobacco abuse counseling: Secondary | ICD-10-CM | POA: Diagnosis not present

## 2020-09-03 DIAGNOSIS — J9382 Other air leak: Secondary | ICD-10-CM | POA: Diagnosis not present

## 2020-09-03 DIAGNOSIS — R001 Bradycardia, unspecified: Secondary | ICD-10-CM | POA: Diagnosis present

## 2020-09-03 DIAGNOSIS — J9383 Other pneumothorax: Secondary | ICD-10-CM | POA: Diagnosis present

## 2020-09-03 DIAGNOSIS — J439 Emphysema, unspecified: Secondary | ICD-10-CM | POA: Diagnosis present

## 2020-09-03 DIAGNOSIS — J438 Other emphysema: Secondary | ICD-10-CM | POA: Diagnosis not present

## 2020-09-03 DIAGNOSIS — F17210 Nicotine dependence, cigarettes, uncomplicated: Secondary | ICD-10-CM | POA: Diagnosis present

## 2020-09-03 LAB — MRSA NEXT GEN BY PCR, NASAL: MRSA by PCR Next Gen: NOT DETECTED

## 2020-09-03 NOTE — ED Notes (Signed)
Patient signed Transfer Consent Form Willingly. 

## 2020-09-03 NOTE — ED Notes (Signed)
Report/Care Handoff given to Karena Addison, Charity fundraiser. RN made aware to call Significant Other upon Patient arrival for Updates.

## 2020-09-03 NOTE — H&P (Addendum)
[address]       [place] [address]             [phone]        Tony Robinson [facility] Medical Record #[id number] Date of Birth: 05-28-1975  Referring: Arby Barrette, MD Primary Care: Patient, No Pcp Per (Inactive) Primary Cardiologist:None  Chief Complaint:    Chief Complaint  Patient presents with   Dizziness   Chest Pain    History of Present Illness:      Tony Robinson is a 45 year old male patient with very limited past medical history that presented to the [facility], ED with left-sided chest pain, shortness of breath, and fatigue.  He was golfing about two weeks ago and around the ninth hole began to have more severe pain which he describes as sharp pain in his left chest.  He went to the ED at that time and underwent a cardiac workup which was negative and it was determined that he pulled a muscle. Yesterday, he was playing with his dogs and he noticed a sharp pain on his right side. He had associated shortness of breath. He also had some tingling in his right arm and his right jaw went numb. He again presented to the emergency room. They did a chest xray which showed a moderate apical pneumothorax, mild mediastinal shift to the left suggesting a tension component.  He then underwent a CT of the chest which showed a moderate right pneumothorax with leftward mediastinal shift which could reflect a tension component. He did not have any nausea or vomiting.  He does smoke cigarettes and drink alcohol but no other drugs. He has never had this happen to him before. We are managing his chest tube this admission.   Current Activity/ Functional Status: Patient was independent with mobility/ambulation, transfers, ADL's, IADL's.   Zubrod Score: At the time of surgery this patient's most appropriate activity status/level should be described as: []     0    Normal activity, no symptoms [x]     1    Restricted in physical strenuous activity but ambulatory, able to  do out light work []     2    Ambulatory and capable of self care, unable to do work activities, up and about                 more than 50%  Of the time                            []     3    Only limited self care, in bed greater than 50% of waking hours []     4    Completely disabled, no self care, confined to bed or chair []     5    Moribund  History reviewed. No pertinent past medical history.  History reviewed. No pertinent surgical history.  Social History   Tobacco Use  Smoking Status Every Day   Packs/day: 0.50   Types: Cigarettes  Smokeless Tobacco Never    Social History   Substance and Sexual Activity  Alcohol Use Yes   Comment: daily     No Known Allergies  Current Facility-Administered Medications  Medication Dose Route Frequency Provider Last Rate Last Admin   0.45 % sodium chloride infusion   Intravenous Continuous , MD   Stopped at 09/03/20 0003   acetaminophen (TYLENOL) tablet 650 mg  650 mg Oral Q6H PRN Loreli Slot, MD       enoxaparin (LOVENOX) injection 40 mg  40 mg Subcutaneous Q24H Loreli Slot, MD   40 mg at 09/02/20 2124   HYDROmorphone (DILAUDID) injection 1 mg  1 mg Intravenous Q2H PRN Loreli Slot, MD   1 mg at 09/03/20 0523   ketorolac (TORADOL) 15 MG/ML injection 15 mg  15 mg Intravenous Q6H Loreli Slot, MD   15 mg at 09/03/20 0645   oxyCODONE (Oxy IR/ROXICODONE) immediate release tablet 5 mg  5 mg Oral Q3H PRN Loreli Slot, MD       zolpidem (AMBIEN) tablet 5 mg  5 mg Oral QHS PRN Loreli Slot, MD        No medications prior to admission.    Family History  Problem Relation Age of Onset   Diabetes Mellitus II Mother      Review of Systems:   Review of Systems  Constitutional:  Positive for malaise/fatigue.  Respiratory:  Positive for shortness of breath.   Cardiovascular:  Positive for chest pain. Negative for leg swelling.  Gastrointestinal:  Negative for  nausea and vomiting.  Neurological:  Positive for tingling (right arm).  Pertinent items are noted in HPI.      Physical Exam: BP (!) 130/93 (BP Location: Right Arm)   Pulse 61   Temp 97.7 F (36.5 C) (Oral)   Resp 13   Ht 6\' 2"  (1.88 m)   Wt 79.4 kg   SpO2 100%   BMI 22.47 kg/m    General appearance: alert, cooperative, and no distress Resp: clear to auscultation bilaterally Cardio: regular rate and rhythm, S1, S2 normal, no murmur, click, rub or gallop GI: soft, non-tender; bowel sounds normal; no masses,  no organomegaly Extremities: extremities normal, atraumatic, no cyanosis or edema Neurologic: Grossly normal  Diagnostic Studies & Laboratory data:     Recent Radiology Findings:   DG Chest 2 View  Result Date: 09/02/2020 CLINICAL DATA:  Chest pain EXAM: CHEST - 2 VIEW COMPARISON:  08/15/2020 FINDINGS: Interval development of right apical pneumothorax approximately 4 cm in thickness. This was not present previously. There was apical pleuroparenchymal scarring bilaterally seen on the prior study. The right lung appears mildly hyperinflated with mild mediastinal shift to the left suggesting possible tension pneumothorax. No pleural effusion Left lung remains clear. IMPRESSION: Moderate right apical pneumothorax. Mild mediastinal shift to the left suggesting tension component. These results were called by telephone at the time of interpretation on 09/02/2020 at 6:50 pm to provider [facility] , who verbally acknowledged these results. Electronically Signed   By: [facility] M.D.   On: 09/02/2020 18:51   CT Chest Wo Contrast  Result Date: 09/02/2020 CLINICAL DATA:  Chest pain while playing with dog stent day, denies injury EXAM: CT CHEST WITHOUT CONTRAST TECHNIQUE: Multidetector CT imaging of the chest was performed following the standard protocol without IV contrast. COMPARISON:  Radiograph 09/02/2020 FINDINGS: Cardiovascular: Limited assessment in the absence of contrast  media. Normal heart size. No pericardial effusion. Normal caliber aorta. No hyperdense mural thickening. No periaortic stranding. Central pulmonary arteries are normal caliber. Luminal evaluation precluded. No major venous abnormality. Mediastinum/Nodes: Very mild leftward mediastinal shift. No mediastinal fluid or gas. Normal thyroid gland and thoracic inlet. No acute abnormality of the trachea or esophagus. No worrisome mediastinal or axillary adenopathy. Hilar nodal evaluation is limited in the absence of intravenous contrast media. Lungs/Pleura: Moderate volume right pneumothorax. Multiple apical  blebs paraseptal emphysematous changes seen within both lung apices. More focal subpleural thickening of the right lung apex could reflect an area of ruptured bleb providing possible explanation for the pneumothorax. Atelectatic changes in the right lung base. Left lung is otherwise clear. No layering pleural effusion. Upper Abdomen: No acute abnormalities present in the visualized portions of the upper abdomen. Musculoskeletal: No acute osseous abnormality or suspicious osseous lesion. IMPRESSION: Moderate right pneumothorax. Minimal leftward mediastinal shift, could reflect a tension component. Apical bullous disease/blebs versus paraseptal emphysematous change. Focal thickening towards the periphery of the right lung apex could reflect a ruptured apical bleb as an explanation for pneumothorax without other concerning CT findings or explanatory causes. Electronically Signed   By: Kreg ShropshirePrice  DeHay M.D.   On: 09/02/2020 20:01   DG Chest Port 1 View  Result Date: 09/03/2020 CLINICAL DATA:  45 year old male with spontaneous pneumothorax, right chest tube. EXAM: PORTABLE CHEST 1 VIEW COMPARISON:  09/02/2020 radiographs and earlier. FINDINGS: Portable AP semi upright view at 0510 hours. Pigtail portion of the right chest tube appears more intrathoracic now, and the right side pneumothorax has largely resolved. Lower lung  volumes compared to the radiographs at presentation. Mediastinal contours remain normal. Visualized tracheal air column is within normal limits. Mild left lower lobe, retrocardiac atelectasis. No pleural effusion. No acute osseous abnormality identified. IMPRESSION: 1. Improved position of the right chest tube now with virtually resolved right pneumothorax. 2. Lower lung volumes, mild left lower lobe atelectasis. Electronically Signed   By: Odessa FlemingH  Hall M.D.   On: 09/03/2020 05:38   DG Chest Portable 1 View  Result Date: 09/02/2020 CLINICAL DATA:  Post chest tube insertion EXAM: PORTABLE CHEST 1 VIEW COMPARISON:  09/02/2020 FINDINGS: The original chest x-ray appears to demonstrate the chest tube was outside of the pleural space. Additional imaging demonstrate repositioning of the right chest tube in the right pleural space. Continued right pneumothorax, stable or slightly larger than prior study. IMPRESSION: Placement of right chest tube with continued moderate to large right pneumothorax, stable or slightly increasing in size. Electronically Signed   By: Charlett NoseKevin  Dover M.D.   On: 09/02/2020 23:45     I have independently reviewed the above radiologic studies and discussed with the patient   Recent Lab Findings: Lab Results  Component Value Date   WBC 8.0 09/02/2020   HGB 15.2 09/02/2020   HCT 44.2 09/02/2020   PLT 230 09/02/2020   GLUCOSE 102 (H) 09/02/2020   ALT 20 04/24/2015   AST 16 04/24/2015   NA 139 09/02/2020   K 3.9 09/02/2020   CL 103 09/02/2020   CREATININE 1.00 09/02/2020   BUN 13 09/02/2020   CO2 26 09/02/2020   HGBA1C 5.4 04/24/2015      Assessment / Plan:      Spontaneous pneumothorax-right pigtail placed in the emergency department and chest x-ray now shows resolution of his right pneumothorax.  Lower lung volumes and mild left lower lobe atelectasis. Chest tube in place with 1+ air leak on suction.  COVID negative Creatinine 1.00, electrolytes okay H and H  15.2/44.2 Cigarette smoker-cessation encouraged  Plan: Continue chest tube to suction. Continue serial CXR. Encouraged ambulation around the unit, can ambulate on water seal. Working on pain control. Possible need for surgery explained by Dr. Dorris FetchHendrickson but hopefully this will resolve and he will not need it. All questions answered to the patient and wife's satisfaction.   I  spent 30 minutes counseling the patient face to face.  Julian Hyessa  Asa Lente, PA-C 09/03/2020 8:01 AM  I have seen and examined Mr. Bora, reviewed records and radiographic images. 45 yo smoker presented with CP, SOB. + spontaneous pneumothorax. Pigtail cathter placed by ED last evening. This morning still has air leak on suction. CXR shows good reexpansion  Informed him of plan to give a few days for leak to resolve spontaneously. If it does not plan would robotic assisted VATS for bleb resection and pleurodesis.  Pain well controlled when I saw him earlier  Viviann Spare C. Dorris Fetch, MD [facility] [phone]

## 2020-09-03 NOTE — ED Notes (Signed)
Patient comfortable at this time. Call [facility].

## 2020-09-03 NOTE — Plan of Care (Signed)
  Problem: Cardiac: Goal: Ability to achieve and maintain adequate cardiopulmonary perfusion will improve Outcome: Progressing Goal: Vascular access site(s) Level 0-1 will be maintained Outcome: Progressing   Problem: Fluid Volume: Goal: Ability to achieve a balanced intake and output will improve Outcome: Progressing   Problem: Physical Regulation: Goal: Complications related to the disease process, condition or treatment will be avoided or minimized Outcome: Progressing   Problem: Respiratory: Goal: Will regain and/or maintain adequate ventilation Outcome: Progressing   Problem: Education: Goal: Knowledge of General Education information will improve Description: Including pain rating scale, medication(s)/side effects and non-pharmacologic comfort measures Outcome: Progressing   Problem: Health Behavior/Discharge Planning: Goal: Ability to manage health-related needs will improve Outcome: Progressing   Problem: Clinical Measurements: Goal: Ability to maintain clinical measurements within normal limits will improve Outcome: Progressing Goal: Will remain free from infection Outcome: Progressing Goal: Diagnostic test results will improve Outcome: Progressing Goal: Respiratory complications will improve Outcome: Progressing Goal: Cardiovascular complication will be avoided Outcome: Progressing   Problem: Activity: Goal: Risk for activity intolerance will decrease Outcome: Progressing   Problem: Nutrition: Goal: Adequate nutrition will be maintained Outcome: Progressing   Problem: Coping: Goal: Level of anxiety will decrease Outcome: Progressing   Problem: Elimination: Goal: Will not experience complications related to bowel motility Outcome: Progressing Goal: Will not experience complications related to urinary retention Outcome: Progressing   Problem: Pain Managment: Goal: General experience of comfort will improve Outcome: Progressing   Problem:  Safety: Goal: Ability to remain free from injury will improve Outcome: Progressing   Problem: Skin Integrity: Goal: Risk for impaired skin integrity will decrease Outcome: Progressing   

## 2020-09-04 ENCOUNTER — Inpatient Hospital Stay (HOSPITAL_COMMUNITY): Payer: 59

## 2020-09-04 NOTE — Progress Notes (Signed)
      [address]       [place] [address]             [phone]         Subjective: Some pain with deep breathing and movement. He is ambulating in the halls.   Objective: Vital signs in last 24 hours: Temp:  [98 F (36.7 C)-99.2 F (37.3 C)] 99.2 F (37.3 C) (08/12 0427) Pulse Rate:  [62-82] 81 (08/12 0427) Cardiac Rhythm: Normal sinus rhythm (08/11 1921) Resp:  [11-17] 17 (08/12 0427) BP: (107-118)/(69-80) 118/80 (08/12 0427) SpO2:  [98 %-100 %] 99 % (08/12 0427)     Intake/Output from previous day: 08/11 0701 - 08/12 0700 In: 1200 [P.O.:1200] Out: 18 [Chest Tube:18] Intake/Output this shift: No intake/output data recorded.  General appearance: alert, cooperative, and no distress Heart: regular rate and rhythm, S1, S2 normal, no murmur, click, rub or gallop Lungs: clear to auscultation bilaterally and faint chest tube rub Abdomen: soft, non-tender; bowel sounds normal; no masses,  no organomegaly Extremities: extremities normal, atraumatic, no cyanosis or edema Wound: clean and dry around the chest tube  Lab Results: Recent Labs    09/02/20 1805  WBC 8.0  HGB 15.2  HCT 44.2  PLT 230   BMET:  Recent Labs    09/02/20 1805  NA 139  K 3.9  CL 103  CO2 26  GLUCOSE 102*  BUN 13  CREATININE 1.00  CALCIUM 9.1    PT/INR: No results for input(s): LABPROT, INR in the last 72 hours. ABG No results found for: PHART, HCO3, TCO2, ACIDBASEDEF, O2SAT CBG (last 3)  No results for input(s): GLUCAP in the last 72 hours.  Assessment/Plan: S/P pigtail catheter placement in the right chest  Chest tube to suction today, intermittent 1+ air leak. CXR stable with resolution of pneumo Pain control- he is on Toradol, dilaudid, and oxycodone for pain.  Lovenox for DVT prophylaxis Discussed no air travel, no scuba diving, and no heavy lifting when discharged home.  Ambien ordered for insomnia  Plan: Suction one more day, will try water seal  tomorrow if CXR remains stable. Maybe home this weekend so will arrange a follow-up appointment.    LOS: 1 day    Sharlene Dory 09/04/2020

## 2020-09-04 NOTE — Plan of Care (Signed)
  Problem: Cardiac: Goal: Ability to achieve and maintain adequate cardiopulmonary perfusion will improve Outcome: Progressing Goal: Vascular access site(s) Level 0-1 will be maintained Outcome: Progressing   Problem: Physical Regulation: Goal: Complications related to the disease process, condition or treatment will be avoided or minimized Outcome: Progressing   Problem: Respiratory: Goal: Will regain and/or maintain adequate ventilation Outcome: Progressing   Problem: Education: Goal: Knowledge of General Education information will improve Description: Including pain rating scale, medication(s)/side effects and non-pharmacologic comfort measures Outcome: Progressing   Problem: Health Behavior/Discharge Planning: Goal: Ability to manage health-related needs will improve Outcome: Progressing   Problem: Clinical Measurements: Goal: Ability to maintain clinical measurements within normal limits will improve Outcome: Progressing Goal: Will remain free from infection Outcome: Progressing Goal: Diagnostic test results will improve Outcome: Progressing Goal: Respiratory complications will improve Outcome: Progressing Goal: Cardiovascular complication will be avoided Outcome: Progressing   Problem: Activity: Goal: Risk for activity intolerance will decrease Outcome: Progressing   Problem: Nutrition: Goal: Adequate nutrition will be maintained Outcome: Progressing   Problem: Coping: Goal: Level of anxiety will decrease Outcome: Progressing   Problem: Elimination: Goal: Will not experience complications related to bowel motility Outcome: Progressing   Problem: Pain Managment: Goal: General experience of comfort will improve Outcome: Progressing   Problem: Safety: Goal: Ability to remain free from injury will improve Outcome: Progressing   Problem: Skin Integrity: Goal: Risk for impaired skin integrity will decrease Outcome: Progressing

## 2020-09-05 ENCOUNTER — Inpatient Hospital Stay (HOSPITAL_COMMUNITY): Payer: 59

## 2020-09-05 DIAGNOSIS — J9311 Primary spontaneous pneumothorax: Secondary | ICD-10-CM | POA: Diagnosis not present

## 2020-09-05 NOTE — Progress Notes (Addendum)
      [address]       [place] [address]             [phone]           Subjective: Patient with some pain at chest tube site.  Objective: Vital signs in last 24 hours: Temp:  [98.8 F (37.1 C)-99 F (37.2 C)] 98.9 F (37.2 C) (08/13 0719) Pulse Rate:  [66-83] 83 (08/13 0719) Cardiac Rhythm: Normal sinus rhythm (08/13 0700) Resp:  [9-20] 20 (08/13 0719) BP: (108-119)/(71-85) 108/71 (08/13 0719) SpO2:  [93 %-98 %] 95 % (08/13 0719)     Intake/Output from previous day: No intake/output data recorded.   Physical Exam:  Cardiovascular: RRR Pulmonary: Clear to auscultation bilaterally, no increased WOB Wounds: Dressing is clean and dry.   Chest Tube: to suction, no air leak  Lab Results: CBC: Recent Labs    09/02/20 1805  WBC 8.0  HGB 15.2  HCT 44.2  PLT 230   BMET:  Recent Labs    09/02/20 1805  NA 139  K 3.9  CL 103  CO2 26  GLUCOSE 102*  BUN 13  CREATININE 1.00  CALCIUM 9.1    PT/INR: No results for input(s): LABPROT, INR in the last 72 hours. ABG:  INR: Will add last result for INR, ABG once components are confirmed Will add last 4 CBG results once components are confirmed  Assessment/Plan:  1. CV - SR 2.  Pulmonary - S/p right pigtail chest tube for right spontaneous pneumothorax. Chest tube with scant output last 24 hours. Chest tube is to suction and there is no air leak. CXR this am appears stable. Place chest tube to water seal. Check CXR in am.    Donielle M [facility] 09/05/2020,9:08 AM [phone]   Agree with above. No leak. Will remove chest tube tomorrow.  Summer Parthasarathy Keane Scrape

## 2020-09-05 NOTE — Plan of Care (Signed)
  Problem: Cardiac: Goal: Ability to achieve and maintain adequate cardiopulmonary perfusion will improve Outcome: Progressing Goal: Vascular access site(s) Level 0-1 will be maintained Outcome: Progressing   Problem: Fluid Volume: Goal: Ability to achieve a balanced intake and output will improve Outcome: Progressing   Problem: Physical Regulation: Goal: Complications related to the disease process, condition or treatment will be avoided or minimized Outcome: Progressing   Problem: Respiratory: Goal: Will regain and/or maintain adequate ventilation Outcome: Progressing   Problem: Education: Goal: Knowledge of General Education information will improve Description: Including pain rating scale, medication(s)/side effects and non-pharmacologic comfort measures Outcome: Progressing   Problem: Health Behavior/Discharge Planning: Goal: Ability to manage health-related needs will improve Outcome: Progressing   Problem: Clinical Measurements: Goal: Ability to maintain clinical measurements within normal limits will improve Outcome: Progressing Goal: Will remain free from infection Outcome: Progressing Goal: Diagnostic test results will improve Outcome: Progressing Goal: Respiratory complications will improve Outcome: Progressing Goal: Cardiovascular complication will be avoided Outcome: Progressing   Problem: Activity: Goal: Risk for activity intolerance will decrease Outcome: Progressing   Problem: Nutrition: Goal: Adequate nutrition will be maintained Outcome: Progressing   Problem: Coping: Goal: Level of anxiety will decrease Outcome: Progressing   Problem: Elimination: Goal: Will not experience complications related to bowel motility Outcome: Progressing   Problem: Pain Managment: Goal: General experience of comfort will improve Outcome: Progressing   Problem: Safety: Goal: Ability to remain free from injury will improve Outcome: Progressing   Problem: Skin  Integrity: Goal: Risk for impaired skin integrity will decrease Outcome: Progressing

## 2020-09-06 ENCOUNTER — Inpatient Hospital Stay (HOSPITAL_COMMUNITY): Payer: 59

## 2020-09-06 DIAGNOSIS — J9311 Primary spontaneous pneumothorax: Secondary | ICD-10-CM | POA: Diagnosis not present

## 2020-09-06 NOTE — Hospital Course (Addendum)
Hospital Course: Patient's right chest tube remained to suction until 08/13. Daily chest x rays were obtained and remained stable. Right chest tube was placed to water seal on 08/14. Follow up chest x ray showed no pneumothorax. Patient has been ambulating on room air. He is tolerating a diet. Chest tube was removed on 09/06/2020. PA/LAT CXR 08/15 showed redevelopment of apical pneumothorax on the right.  He was kept in the hospital for observation.  Repeat CXR on 8/16 showed slight increase in pneumothorax.  It was felt patient should undergo Robotic Assisted Right Video Thoracoscopy with resection of blebs, mechanical pleurodesis, pleurectomy and intercostal nerve block.  The risks and benefits of the procedure were explained to the patient and his wife and he was agreeable to proceed.  He was taken to the operating room on 8/16 and underwent above mentioned procedure.  He tolerated the procedure without difficulty and was taken to the PACU in stable condition.  His chest tube had an intermittent +1 air leak.  CXR showed a small apical pneumothorax on the right.  His air leak resolved and his chest tube was placed on water seal on 09/10/2020.   The patient's CXR showed a slight increase in the apical pneumothorax.  His chest tube was clamped and follow up CXR showed no evidence of pneumothorax.  His chest tube was removed on 09/11/2020.  Follow up CXR showed ***.  He is ambulating without difficulty.  His surgical incisions are healing without evidence of infection.  He is medically stable for discharge home today.

## 2020-09-06 NOTE — Progress Notes (Addendum)
      [address]       [place] [address]             [phone]           Subjective: Patient without complaints this am.  Objective: Vital signs in last 24 hours: Temp:  [98.2 F (36.8 C)-99.2 F (37.3 C)] 98.7 F (37.1 C) (08/14 0309) Pulse Rate:  [56-76] 56 (08/14 0309) Cardiac Rhythm: Sinus bradycardia (08/14 0700) Resp:  [10-18] 11 (08/14 0309) BP: (109-124)/(71-90) 124/90 (08/14 0309) SpO2:  [94 %-97 %] 97 % (08/14 0309)     Intake/Output from previous day: 08/13 0701 - 08/14 0700 In: -  Out: 18 [Chest Tube:18]   Physical Exam:  Cardiovascular: RRR Pulmonary: Clear to auscultation bilaterally, no increased WOB Wounds: Dressing is clean and dry.   Chest Tube: to water seal, no air leak  Lab Results: CBC: No results for input(s): WBC, HGB, HCT, PLT in the last 72 hours.  BMET:  No results for input(s): NA, K, CL, CO2, GLUCOSE, BUN, CREATININE, CALCIUM in the last 72 hours.   PT/INR: No results for input(s): LABPROT, INR in the last 72 hours. ABG:  INR: Will add last result for INR, ABG once components are confirmed Will add last 4 CBG results once components are confirmed  Assessment/Plan:  1. CV - SR 2.  Pulmonary - S/p right pigtail chest tube for right spontaneous pneumothorax. Chest tube with 18 cc output last 24 hours. Chest tube is to water seal and there is no air leak. CXR this am appears stable.  Remove chest tube. Check CXR in am. 3. If CXR stable in am, will discharge   Donielle M ZimmermanPA-C 09/06/2020,8:21 AM [phone]   Chest tube removed today and followup CXR shows no ptx. Will repeat in am and if no change he can go home.

## 2020-09-06 NOTE — Discharge Instructions (Addendum)

## 2020-09-07 ENCOUNTER — Inpatient Hospital Stay (HOSPITAL_COMMUNITY): Payer: 59

## 2020-09-07 DIAGNOSIS — J9311 Primary spontaneous pneumothorax: Secondary | ICD-10-CM | POA: Diagnosis not present

## 2020-09-07 NOTE — Discharge Summary (Signed)
[address]       [place] [address]             [phone]       Physician Discharge Summary  Patient ID: Tony Robinson MRN: [id number] DOB/AGE: 05/22/1975 45 y.o.  Admit date: 09/02/2020 Discharge date: 09/11/2020  Admission Diagnoses: Patient Active Problem List   Diagnosis Date Noted   Spontaneous pneumothorax 09/02/2020   Ileitis 04/24/2015   Enteritis 04/24/2015   STREPTOCOCCAL PHARYNGITIS 05/01/2007   HEMORRHOIDS 11/03/2005     Discharge Diagnoses:  Patient Active Problem List   Diagnosis Date Noted   S/P Robotic Assisted Video Thoracoscopy in Right with resection apical blebs, pleurectomy, mechanical pleurodesis, and intercostal nerve block 09/11/2020   Spontaneous pneumothorax 09/02/2020   Ileitis 04/24/2015   Enteritis 04/24/2015   STREPTOCOCCAL PHARYNGITIS 05/01/2007   HEMORRHOIDS 11/03/2005   Discharged Condition: good  HPI:   Tony Robinson is a 45 year old male patient with very limited past medical history that presented to the [facility], ED with left-sided chest pain, shortness of breath, and fatigue.  He was golfing about two weeks ago and around the ninth hole began to have more severe pain which he describes as sharp pain in his left chest.  He went to the ED at that time and underwent a cardiac workup which was negative and it was determined that he pulled a muscle. Yesterday, he was playing with his dogs and he noticed a sharp pain on his right side. He had associated shortness of breath. He also had some tingling in his right arm and his right jaw went numb. He again presented to the emergency room. They did a chest xray which showed a moderate apical pneumothorax, mild mediastinal shift to the left suggesting a tension component.  He then underwent a CT of the chest which showed a moderate right pneumothorax with leftward mediastinal shift which could reflect a tension component. He did not have any nausea or vomiting.  He does smoke  cigarettes and drink alcohol but no other drugs. He has never had this happen to him before. We are managing his chest tube this admission.     Hospital Course: Patient's right chest tube remained to suction until 08/13. Daily chest x rays were obtained and remained stable. Right chest tube was placed to water seal on 08/14. Follow up chest x ray showed no pneumothorax. Patient has been ambulating on room air. He is tolerating a diet. Chest tube was removed on 09/06/2020. PA/LAT CXR 08/15 showed redevelopment of apical pneumothorax on the right.  He was kept in the hospital for observation.  Repeat CXR on 8/16 showed slight increase in pneumothorax.  It was felt patient should undergo Robotic Assisted Right Video Thoracoscopy with resection of blebs, mechanical pleurodesis, pleurectomy and intercostal nerve block.  The risks and benefits of the procedure were explained to the patient and his wife and he was agreeable to proceed.  He was taken to the operating room on 8/16 and underwent above mentioned procedure.  He tolerated the procedure without difficulty and was taken to the PACU in stable condition.  His chest tube had an intermittent +1 air leak.  CXR showed a small apical pneumothorax on the right.  His air leak resolved and his chest tube was placed on water seal on 09/10/2020.   The patient's CXR showed a slight increase in the apical pneumothorax.  His chest tube was clamped and follow up CXR showed no  evidence of pneumothorax.  His chest tube was removed on 09/11/2020.  Follow up CXR showed no change in small pneumothorax.  He is ambulating without difficulty.  His surgical incisions are healing without evidence of infection.  He is medically stable for discharge home today.   Consults: None  Significant Diagnostic Studies:   CLINICAL DATA:  45 year old male with spontaneous pneumothorax status post chest tube. Smoker.   EXAM: CHEST - 2 VIEW   COMPARISON:  Portable chest 09/06/2020 and  earlier.   FINDINGS: PA and lateral views of the chest today. Small to moderate volume right pneumothorax, smaller than that on the presentation study 09/02/2020. No mediastinal shift. Stable lung volumes. No pleural effusion. Visualized tracheal air column is within normal limits. Stable underlying interstitial markings. No acute osseous abnormality identified. Negative visible bowel gas pattern.   IMPRESSION: Recurrent right pneumothorax, but smaller than that at presentation on 09/02/2020.     Electronically Signed   By: Odessa Fleming M.D.   On: 09/07/2020 06:54  Treatments:  Procedures CHEST TUBE INSERTION   Date/Time: 09/03/2020 12:07 AM Performed by: Arby Barrette, MD Authorized by: Arby Barrette, MD    Consent:    Consent obtained:  Verbal   Consent given by:  Patient   Risks discussed:  Bleeding, damage to surrounding structures, infection, pain, incomplete drainage and nerve damage Pre-procedure details:    Skin preparation:  Chlorhexidine   Preparation: Patient was prepped and draped in the usual sterile fashion   Sedation:    Sedation type:  Anxiolysis Anesthesia:    Anesthesia method:  Local infiltration   Local anesthetic:  Lidocaine 1% w/o epi Procedure details:    Placement location:  R lateral   Scalpel size:  11   Tube size (Fr):  Minicatheter   Ultrasound guidance: no     Tension pneumothorax: no     Tube connected to:  Heimlich valve and water seal   Drainage characteristics:  Air only   Suture material:  0 silk Post-procedure details:    Post-insertion x-ray findings: tube repositioned     Procedure completion:  Tolerated well, no immediate complications Comments:     Patient was prepped and draped with sterile technique.  Seldinger technique used for insertion of Wayne pigtail catheter.  Upon first procedure good air return to syringe but resistance when putting in the catheter.  Chest x-ray obtained and first catheter confirmed to be on the  outside of the chest wall.  Sterile field maintained but new set up used and new drape.  Upon second attempt, wire and catheter threaded without any resistance and chest tube confirmed by x-ray within the thoracic space.  Heimlich valve applied and attached to waterseal.  Patient tolerated procedure well without discomfort or distress. Vital signs remained stable through out.     Discharge Exam: Blood pressure 110/77, pulse 64, temperature 98.4 F (36.9 C), temperature source Oral, resp. rate 20, height 6\' 2"  (1.88 m), weight 79.4 kg, SpO2 98 %.  General appearance: alert, cooperative, and no distress Heart: regular rate and rhythm Lungs: clear to auscultation bilaterally Abdomen: soft, non-tender; bowel sounds normal; no masses,  no organomegaly Extremities: extremities normal, atraumatic, no cyanosis or edema Wound: clean and dry  Disposition: Discharge disposition: 01-Home or Self Care       Allergies as of 09/11/2020   No Known Allergies      Medication List     TAKE these medications    acetaminophen 500 MG tablet Commonly known as:  TYLENOL Take 500 mg by mouth every 6 (six) hours as needed for moderate pain.   ibuprofen 200 MG tablet Commonly known as: ADVIL Take 400 mg by mouth every 6 (six) hours as needed for mild pain.   oxyCODONE 5 MG immediate release tablet Commonly known as: Oxy IR/ROXICODONE Take 1 tablet (5 mg total) by mouth every 6 (six) hours as needed for moderate pain or breakthrough pain.        Follow-up Information     Corliss Skains, MD Follow up on 09/18/2020.   Specialty: Cardiothoracic Surgery Why: Appointment is at 1:30 Contact information: [address] [place] [address] [phone]                 Signed: Sharlene Dory 09/11/2020, 6:17 PM

## 2020-09-07 NOTE — Progress Notes (Addendum)
      [address]       [place] [address]             [phone]       Subjective: Patient up in bed without complaints.  He is hoping to go home today  Objective: Vital signs in last 24 hours: Temp:  [97.7 F (36.5 C)-98.8 F (37.1 C)] 98 F (36.7 C) (08/15 0716) Pulse Rate:  [59-96] 59 (08/15 0716) Resp:  [11-20] 20 (08/15 0716) BP: (109-119)/(66-83) 118/83 (08/15 0716) SpO2:  [94 %-98 %] 98 % (08/15 0716)  General appearance: alert, cooperative, and no distress Heart: regular rate and rhythm Lungs: CTA on left, diminished right apex Wound: clean and dry  Lab Results: No results for input(s): WBC, HGB, HCT, PLT in the last 72 hours. BMET: No results for input(s): NA, K, CL, CO2, GLUCOSE, BUN, CREATININE, CALCIUM in the last 72 hours.  PT/INR: No results for input(s): LABPROT, INR in the last 72 hours. ABG No results found for: PHART, HCO3, TCO2, ACIDBASEDEF, O2SAT CBG (last 3)  No results for input(s): GLUCAP in the last 72 hours.  Assessment/Plan:  CV- Sinus Bradycardia, BP stable Pulm- Spontaneous pneumothorax, CT removed yesterday, initial CXR was free of pneumothorax, repeat AM CXR with apical pneumothorax... will repeat CXR in AM... CT scan does have bullous disease, patient may require VATS for definitive treatment Dispo- patient stable, new right apical pneumothorax post chest tube removal, repeat CXR in AM   LOS: 4 days    Lowella Dandy, PA-C 09/07/2020  Agree with above If CXR worst, OR tomorrow  Corliss Skains

## 2020-09-08 ENCOUNTER — Inpatient Hospital Stay (HOSPITAL_COMMUNITY): Payer: 59

## 2020-09-08 ENCOUNTER — Inpatient Hospital Stay (HOSPITAL_COMMUNITY): Payer: 59 | Admitting: Certified Registered Nurse Anesthetist

## 2020-09-08 ENCOUNTER — Encounter (HOSPITAL_COMMUNITY): Payer: Self-pay | Admitting: Thoracic Surgery (Cardiothoracic Vascular Surgery)

## 2020-09-08 ENCOUNTER — Encounter (HOSPITAL_COMMUNITY)
Admission: EM | Disposition: A | Discharge status: Home / Self Care | Payer: Self-pay | Source: Home / Self Care | Attending: Thoracic Surgery (Cardiothoracic Vascular Surgery)

## 2020-09-08 DIAGNOSIS — J438 Other emphysema: Secondary | ICD-10-CM

## 2020-09-08 DIAGNOSIS — J9311 Primary spontaneous pneumothorax: Secondary | ICD-10-CM

## 2020-09-08 HISTORY — PX: VIDEO ASSISTED THORACOSCOPY (VATS) W/TALC PLEUADESIS: SHX6168

## 2020-09-08 HISTORY — PX: INTERCOSTAL NERVE BLOCK: SHX5021

## 2020-09-08 LAB — TYPE AND SCREEN
ABO/RH(D): O POS
Antibody Screen: NEGATIVE

## 2020-09-08 LAB — ABO/RH: ABO/RH(D): O POS

## 2020-09-08 SURGERY — VIDEO ASSISTED THORACOSCOPY (VATS) W/TALC PLEUADESIS
Anesthesia: General | Laterality: Right

## 2020-09-08 MED ORDER — PHENYLEPHRINE HCL-NACL 20-0.9 MG/250ML-% IV SOLN
INTRAVENOUS | Status: DC | PRN
  Administered 2020-09-08: 50 ug/min via INTRAVENOUS

## 2020-09-08 MED ORDER — SODIUM CHLORIDE 0.9 % IV SOLN: INTRAVENOUS | Status: DC | PRN

## 2020-09-08 MED ORDER — 0.9 % SODIUM CHLORIDE (POUR BTL) OPTIME
TOPICAL | Status: DC | PRN
  Administered 2020-09-08 (×2): 1000 mL

## 2020-09-08 MED ORDER — BISACODYL 5 MG PO TBEC
10.0000 mg | DELAYED_RELEASE_TABLET | Freq: Every day | ORAL | Status: DC
  Administered 2020-09-08 – 2020-09-11 (×3): 10 mg via ORAL
  Filled 2020-09-08 (×4): qty 2

## 2020-09-08 MED ORDER — PROMETHAZINE HCL 25 MG/ML IJ SOLN: 6.2500 mg | INTRAMUSCULAR | Status: DC | PRN

## 2020-09-08 MED ORDER — MIDAZOLAM HCL 2 MG/2ML IJ SOLN
INTRAMUSCULAR | Status: AC
  Filled 2020-09-08: qty 2

## 2020-09-08 MED ORDER — CEFAZOLIN SODIUM-DEXTROSE 2-4 GM/100ML-% IV SOLN
INTRAVENOUS | Status: AC
  Filled 2020-09-08: qty 100

## 2020-09-08 MED ORDER — BUPIVACAINE HCL (PF) 0.5 % IJ SOLN
INTRAMUSCULAR | Status: AC
  Filled 2020-09-08: qty 30

## 2020-09-08 MED ORDER — FENTANYL CITRATE (PF) 100 MCG/2ML IJ SOLN
INTRAMUSCULAR | Status: AC
  Filled 2020-09-08: qty 2

## 2020-09-08 MED ORDER — ACETAMINOPHEN 500 MG PO TABS
1000.0000 mg | ORAL_TABLET | Freq: Four times a day (QID) | ORAL | Status: DC
  Administered 2020-09-08 – 2020-09-11 (×12): 1000 mg via ORAL
  Filled 2020-09-08 (×12): qty 2

## 2020-09-08 MED ORDER — PROPOFOL 10 MG/ML IV BOLUS
INTRAVENOUS | Status: AC
  Filled 2020-09-08: qty 20

## 2020-09-08 MED ORDER — BUPIVACAINE LIPOSOME 1.3 % IJ SUSP
INTRAMUSCULAR | Status: AC
  Filled 2020-09-08: qty 20

## 2020-09-08 MED ORDER — PHENYLEPHRINE HCL (PRESSORS) 10 MG/ML IV SOLN
INTRAVENOUS | Status: DC | PRN
Start: 2020-09-08 — End: 2020-09-08
  Administered 2020-09-08 (×2): 80 ug via INTRAVENOUS

## 2020-09-08 MED ORDER — LACTATED RINGERS IV SOLN
INTRAVENOUS | Status: DC | PRN
Start: 2020-09-08 — End: 2020-09-08

## 2020-09-08 MED ORDER — DEXAMETHASONE SODIUM PHOSPHATE 10 MG/ML IJ SOLN
INTRAMUSCULAR | Status: DC | PRN
  Administered 2020-09-08: 10 mg via INTRAVENOUS

## 2020-09-08 MED ORDER — KETOROLAC TROMETHAMINE 30 MG/ML IJ SOLN
30.0000 mg | Freq: Four times a day (QID) | INTRAMUSCULAR | Status: DC | PRN
  Administered 2020-09-08 – 2020-09-09 (×3): 30 mg via INTRAVENOUS
  Filled 2020-09-08 (×3): qty 1

## 2020-09-08 MED ORDER — MIDAZOLAM HCL 2 MG/2ML IJ SOLN
INTRAMUSCULAR | Status: DC | PRN
  Administered 2020-09-08: 2 mg via INTRAVENOUS

## 2020-09-08 MED ORDER — SODIUM CHLORIDE FLUSH 0.9 % IV SOLN
INTRAVENOUS | Status: DC | PRN
  Administered 2020-09-08: 100 mL

## 2020-09-08 MED ORDER — FENTANYL CITRATE (PF) 250 MCG/5ML IJ SOLN
INTRAMUSCULAR | Status: DC | PRN
  Administered 2020-09-08 (×3): 50 ug via INTRAVENOUS
  Administered 2020-09-08: 100 ug via INTRAVENOUS

## 2020-09-08 MED ORDER — FENTANYL CITRATE (PF) 100 MCG/2ML IJ SOLN
25.0000 ug | INTRAMUSCULAR | Status: DC | PRN
  Administered 2020-09-08 (×2): 50 ug via INTRAVENOUS

## 2020-09-08 MED ORDER — LACTATED RINGERS IV SOLN: INTRAVENOUS | Status: DC

## 2020-09-08 MED ORDER — SENNOSIDES-DOCUSATE SODIUM 8.6-50 MG PO TABS
1.0000 | ORAL_TABLET | Freq: Every day | ORAL | Status: DC
  Administered 2020-09-08: 1 via ORAL
  Filled 2020-09-08 (×2): qty 1

## 2020-09-08 MED ORDER — CHLORHEXIDINE GLUCONATE 0.12 % MT SOLN: 15.0000 mL | Freq: Once | OROMUCOSAL | Status: AC

## 2020-09-08 MED ORDER — SUGAMMADEX SODIUM 200 MG/2ML IV SOLN
INTRAVENOUS | Status: DC | PRN
  Administered 2020-09-08: 200 mg via INTRAVENOUS

## 2020-09-08 MED ORDER — KETOROLAC TROMETHAMINE 30 MG/ML IJ SOLN
INTRAMUSCULAR | Status: AC
  Filled 2020-09-08: qty 1

## 2020-09-08 MED ORDER — ORAL CARE MOUTH RINSE: 15.0000 mL | Freq: Once | OROMUCOSAL | Status: AC

## 2020-09-08 MED ORDER — ACETAMINOPHEN 160 MG/5ML PO SOLN
1000.0000 mg | Freq: Four times a day (QID) | ORAL | Status: DC
  Administered 2020-09-08: 1000 mg via ORAL
  Filled 2020-09-08: qty 40.6

## 2020-09-08 MED ORDER — HEMOSTATIC AGENTS (NO CHARGE) OPTIME
TOPICAL | Status: DC | PRN
  Administered 2020-09-08: 1 via TOPICAL

## 2020-09-08 MED ORDER — ONDANSETRON HCL 4 MG/2ML IJ SOLN: 4.0000 mg | Freq: Four times a day (QID) | INTRAMUSCULAR | Status: DC | PRN

## 2020-09-08 MED ORDER — LIDOCAINE 2% (20 MG/ML) 5 ML SYRINGE
INTRAMUSCULAR | Status: DC | PRN
  Administered 2020-09-08: 80 mg via INTRAVENOUS

## 2020-09-08 MED ORDER — ROCURONIUM BROMIDE 10 MG/ML (PF) SYRINGE
PREFILLED_SYRINGE | INTRAVENOUS | Status: DC | PRN
  Administered 2020-09-08: 70 mg via INTRAVENOUS
  Administered 2020-09-08: 20 mg via INTRAVENOUS

## 2020-09-08 MED ORDER — ONDANSETRON HCL 4 MG/2ML IJ SOLN
INTRAMUSCULAR | Status: DC | PRN
  Administered 2020-09-08: 4 mg via INTRAVENOUS

## 2020-09-08 MED ORDER — FENTANYL CITRATE (PF) 250 MCG/5ML IJ SOLN
INTRAMUSCULAR | Status: AC
  Filled 2020-09-08: qty 5

## 2020-09-08 MED ORDER — KETOROLAC TROMETHAMINE 30 MG/ML IJ SOLN
30.0000 mg | Freq: Once | INTRAMUSCULAR | Status: AC
  Administered 2020-09-08: 30 mg via INTRAVENOUS

## 2020-09-08 MED ORDER — ENOXAPARIN SODIUM 40 MG/0.4ML IJ SOSY
40.0000 mg | PREFILLED_SYRINGE | Freq: Every day | INTRAMUSCULAR | Status: DC
  Administered 2020-09-09: 40 mg via SUBCUTANEOUS
  Filled 2020-09-08 (×3): qty 0.4

## 2020-09-08 MED ORDER — CHLORHEXIDINE GLUCONATE 0.12 % MT SOLN
OROMUCOSAL | Status: AC
  Administered 2020-09-08: 15 mL via OROMUCOSAL
  Filled 2020-09-08: qty 15

## 2020-09-08 MED ORDER — CEFAZOLIN SODIUM-DEXTROSE 2-4 GM/100ML-% IV SOLN
2.0000 g | INTRAVENOUS | Status: AC
  Administered 2020-09-08: 2 g via INTRAVENOUS

## 2020-09-08 MED ORDER — PROPOFOL 10 MG/ML IV BOLUS
INTRAVENOUS | Status: DC | PRN
  Administered 2020-09-08: 140 mg via INTRAVENOUS

## 2020-09-08 SURGICAL SUPPLY — 100 items
BLADE CLIPPER SURG (BLADE) ×4 IMPLANT
BNDG COHESIVE 6X5 TAN STRL LF (GAUZE/BANDAGES/DRESSINGS) ×4 IMPLANT
CANISTER SUCT 3000ML PPV (MISCELLANEOUS) ×8 IMPLANT
CANNULA REDUC XI 12-8 STAPL (CANNULA) ×6
CANNULA REDUC XI 12-8MM STAPL (CANNULA) ×2
CANNULA REDUCER 12-8 DVNC XI (CANNULA) ×4 IMPLANT
CATH TROCAR 20FR (CATHETERS) IMPLANT
CHLORAPREP W/TINT 26 (MISCELLANEOUS) ×4 IMPLANT
CLIP VESOCCLUDE MED 6/CT (CLIP) IMPLANT
CNTNR URN SCR LID CUP LEK RST (MISCELLANEOUS) ×4 IMPLANT
CONN ST 1/4X3/8  BEN (MISCELLANEOUS)
CONN ST 1/4X3/8 BEN (MISCELLANEOUS) IMPLANT
CONT SPEC 4OZ STRL OR WHT (MISCELLANEOUS) ×8
DEFOGGER SCOPE WARMER CLEARIFY (MISCELLANEOUS) ×4 IMPLANT
DERMABOND ADVANCED (GAUZE/BANDAGES/DRESSINGS) ×2
DERMABOND ADVANCED .7 DNX12 (GAUZE/BANDAGES/DRESSINGS) ×2 IMPLANT
DRAIN CHANNEL 28F RND 3/8 FF (WOUND CARE) IMPLANT
DRAIN CHANNEL 32F RND 10.7 FF (WOUND CARE) IMPLANT
DRAPE ARM DVNC X/XI (DISPOSABLE) ×8 IMPLANT
DRAPE COLUMN DVNC XI (DISPOSABLE) ×2 IMPLANT
DRAPE CV SPLIT W-CLR ANES SCRN (DRAPES) ×4 IMPLANT
DRAPE DA VINCI XI ARM (DISPOSABLE) ×16
DRAPE DA VINCI XI COLUMN (DISPOSABLE) ×4
DRAPE HALF SHEET 40X57 (DRAPES) ×4 IMPLANT
DRAPE ORTHO SPLIT 77X108 STRL (DRAPES) ×4
DRAPE SURG ORHT 6 SPLT 77X108 (DRAPES) ×2 IMPLANT
ELECT BLADE 6.5 EXT (BLADE) IMPLANT
ELECT REM PT RETURN 9FT ADLT (ELECTROSURGICAL) ×4
ELECTRODE REM PT RTRN 9FT ADLT (ELECTROSURGICAL) ×2 IMPLANT
GAUZE KITTNER 4X5 RF (MISCELLANEOUS) ×4 IMPLANT
GAUZE SPONGE 4X4 12PLY STRL (GAUZE/BANDAGES/DRESSINGS) ×4 IMPLANT
GLOVE SURG ENC MOIS LTX SZ7.5 (GLOVE) ×8 IMPLANT
GOWN STRL REUS W/ TWL LRG LVL3 (GOWN DISPOSABLE) ×4 IMPLANT
GOWN STRL REUS W/ TWL XL LVL3 (GOWN DISPOSABLE) ×6 IMPLANT
GOWN STRL REUS W/TWL 2XL LVL3 (GOWN DISPOSABLE) ×4 IMPLANT
GOWN STRL REUS W/TWL LRG LVL3 (GOWN DISPOSABLE) ×8
GOWN STRL REUS W/TWL XL LVL3 (GOWN DISPOSABLE) ×12
HEMOSTAT SURGICEL 2X14 (HEMOSTASIS) ×4 IMPLANT
KIT BASIN OR (CUSTOM PROCEDURE TRAY) ×4 IMPLANT
KIT SUCTION CATH 14FR (SUCTIONS) IMPLANT
KIT TURNOVER KIT B (KITS) ×4 IMPLANT
LOOP VESSEL SUPERMAXI WHITE (MISCELLANEOUS) IMPLANT
NEEDLE 22X1 1/2 (OR ONLY) (NEEDLE) ×4 IMPLANT
NEEDLE HYPO 25GX1X1/2 BEV (NEEDLE) ×4 IMPLANT
NS IRRIG 1000ML POUR BTL (IV SOLUTION) ×12 IMPLANT
OBTURATOR OPTICAL STANDARD 8MM (TROCAR)
OBTURATOR OPTICAL STND 8 DVNC (TROCAR)
OBTURATOR OPTICALSTD 8 DVNC (TROCAR) IMPLANT
PACK CHEST (CUSTOM PROCEDURE TRAY) ×4 IMPLANT
PAD ARMBOARD 7.5X6 YLW CONV (MISCELLANEOUS) ×20 IMPLANT
PORT ACCESS TROCAR AIRSEAL 12 (TROCAR) ×2 IMPLANT
PORT ACCESS TROCAR AIRSEAL 5M (TROCAR) ×2
RELOAD STAPLER 3.5X45 BLU DVNC (STAPLE) ×10 IMPLANT
RELOAD STAPLER 4.3X45 GRN DVNC (STAPLE) ×2 IMPLANT
SEAL CANN UNIV 5-8 DVNC XI (MISCELLANEOUS) ×4 IMPLANT
SEAL XI 5MM-8MM UNIVERSAL (MISCELLANEOUS) ×8
SEALANT SURG COSEAL 4ML (VASCULAR PRODUCTS) IMPLANT
SEALANT SURG COSEAL 8ML (VASCULAR PRODUCTS) IMPLANT
SET TRI-LUMEN FLTR TB AIRSEAL (TUBING) ×4 IMPLANT
SOLUTION ELECTROLUBE (MISCELLANEOUS) IMPLANT
SPONGE INTESTINAL PEANUT (DISPOSABLE) IMPLANT
STAPLER 45 DA VINCI SURE FORM (STAPLE) ×4
STAPLER 45 SUREFORM DVNC (STAPLE) ×2 IMPLANT
STAPLER CANNULA SEAL DVNC XI (STAPLE) ×4 IMPLANT
STAPLER CANNULA SEAL XI (STAPLE) ×8
STAPLER RELOAD 3.5X45 BLU DVNC (STAPLE) ×10
STAPLER RELOAD 3.5X45 BLUE (STAPLE) ×20
STAPLER RELOAD 4.3X45 GREEN (STAPLE) ×4
STAPLER RELOAD 4.3X45 GRN DVNC (STAPLE) ×2
STOPCOCK 4 WAY LG BORE MALE ST (IV SETS) ×4 IMPLANT
SUT MON AB 2-0 CT1 36 (SUTURE) IMPLANT
SUT PDS AB 1 CTX 36 (SUTURE) IMPLANT
SUT PROLENE 4 0 RB 1 (SUTURE)
SUT PROLENE 4-0 RB1 .5 CRCL 36 (SUTURE) IMPLANT
SUT SILK  1 MH (SUTURE) ×4
SUT SILK 1 MH (SUTURE) ×2 IMPLANT
SUT SILK 1 TIES 10X30 (SUTURE) IMPLANT
SUT SILK 2 0 SH (SUTURE) IMPLANT
SUT SILK 2 0SH CR/8 30 (SUTURE) IMPLANT
SUT VIC AB 1 CTX 36 (SUTURE)
SUT VIC AB 1 CTX36XBRD ANBCTR (SUTURE) IMPLANT
SUT VIC AB 2-0 CT1 27 (SUTURE) ×8
SUT VIC AB 2-0 CT1 TAPERPNT 27 (SUTURE) ×4 IMPLANT
SUT VIC AB 3-0 SH 27 (SUTURE) ×12
SUT VIC AB 3-0 SH 27X BRD (SUTURE) ×6 IMPLANT
SUT VICRYL 0 TIES 12 18 (SUTURE) ×4 IMPLANT
SUT VICRYL 0 UR6 27IN ABS (SUTURE) ×8 IMPLANT
SUT VICRYL 2 TP 1 (SUTURE) IMPLANT
SYR 10ML LL (SYRINGE) ×4 IMPLANT
SYR 20ML LL LF (SYRINGE) ×4 IMPLANT
SYR 50ML LL SCALE MARK (SYRINGE) ×4 IMPLANT
SYSTEM SAHARA CHEST DRAIN ATS (WOUND CARE) ×4 IMPLANT
TAPE CLOTH 4X10 WHT NS (GAUZE/BANDAGES/DRESSINGS) ×4 IMPLANT
TAPE CLOTH SURG 4X10 WHT LF (GAUZE/BANDAGES/DRESSINGS) ×4 IMPLANT
TIP APPLICATOR SPRAY EXTEND 16 (VASCULAR PRODUCTS) IMPLANT
TOWEL GREEN STERILE (TOWEL DISPOSABLE) ×4 IMPLANT
TRAY FOLEY MTR SLVR 16FR STAT (SET/KITS/TRAYS/PACK) ×4 IMPLANT
TROCAR BLADELESS 15MM (ENDOMECHANICALS) IMPLANT
TUBING EXTENTION W/L.L. (IV SETS) ×4 IMPLANT
WATER STERILE IRR 1000ML POUR (IV SOLUTION) ×4 IMPLANT

## 2020-09-08 NOTE — Progress Notes (Addendum)
      [address]       [place] [address]             [phone]       Subjective: No complaints.  Wife present today, anxious about needing surgery as patient hasn't had surgery before  Objective: Vital signs in last 24 hours: Temp:  [97.6 F (36.4 C)-98.9 F (37.2 C)] 97.6 F (36.4 C) (08/16 0342) Pulse Rate:  [59-73] 73 (08/16 0342) Cardiac Rhythm: Normal sinus rhythm (08/16 0700) Resp:  [12-15] 15 (08/16 0342) BP: (116-134)/(83-91) 134/91 (08/16 0342) SpO2:  [94 %-97 %] 94 % (08/15 1547)   General appearance: alert, cooperative, and no distress Neurologic: intact Heart: regular rate and rhythm Lungs: diminshed right apex Abdomen: soft, non-tender; bowel sounds normal; no masses,  no organomegaly  Lab Results: No results for input(s): WBC, HGB, HCT, PLT in the last 72 hours. BMET: No results for input(s): NA, K, CL, CO2, GLUCOSE, BUN, CREATININE, CALCIUM in the last 72 hours.  PT/INR: No results for input(s): LABPROT, INR in the last 72 hours. ABG No results found for: PHART, HCO3, TCO2, ACIDBASEDEF, O2SAT CBG (last 3)  No results for input(s): GLUCAP in the last 72 hours.  Assessment/Plan:  S/P Spontaneous pneumothorax, continued increase in pneumothorax post chest tube removal Plan for Robotic Assisted VATS this morning for treatment of blebs with Dr. Cliffton Asters   LOS: 5 days    Lowella Dandy, PA-C 09/08/2020  Agree with above OR today for R RATS, wedge resection, apical pleurectomy, and mechanical pleurodesis  Cece Milhouse Keane Scrape

## 2020-09-08 NOTE — Op Note (Signed)
      [address]       [place] [address]             [phone]        09/08/2020  Patient:  Rosana Fret Pre-Op Dx: Spontaneous right pneumothorax   Bullous emphysema Post-op Dx: Same Procedure:  - Robotic assisted right video thoracoscopy - Wedge resection of the right upper and middle lobe - Apical pleurectomy - Mechanical pleurodesis - Intercostal nerve block  Surgeon and Role:      * Corliss Skains, MD - Primary    *M. Roddenberry, PA-C- assisting  Anesthesia  general EBL: 10 ml Blood Administration: None Specimen: Right upper lobe wedge x2, right middle lobe wedge x2, apical pleurectomy.  Drains: 50 F argyle chest tube in right chest Counts: correct   Indications: 45 year old male admitted with a spontaneous right pneumothorax that was treated with tube thoracotomy.  He had early recurrence in his pneumothorax after the chest tube was removed.  Given the evidence of bullous disease on cross-sectional imaging it was recommended that he undergo surgical intervention.  Findings: Bullous disease involving the apical segments of the upper lobe.  There are also 2 bullae in the middle lobe close to the fissure.  Operative Technique: After the risks, benefits and alternatives were thoroughly discussed, the patient was brought to the operative theatre.  Anesthesia was induced, and the bronchoscope was passed through the endotracheal tube.  All segmental bronchi were visualized.  The endotracheal tube was then exchanged for a double lumen tube.  The patient was then placed in a left lateral decubitus position and was prepped and draped in normal sterile fashion.  An appropriate surgical pause was performed, and pre-operative antibiotics were dosed accordingly.  We began by placing our 4 robotic ports in the the 7th intercostal space targeting the hilum of the lung.  A 40mm assistant port was placed in the 9th intercostal space in the anterior  axillary line.  The robot was then docked and all instruments were passed under direct visualization.    The lung was then retracted superiorly, and the inferior pulmonary ligament was divided.  The lung was freed of all pleural adhesions.  Wedge resections of the upper and middle lobe were performed.  The apical parietal pleural was removed with a combination of cautery and blunt dissection.  The chest was irrigated, and an air leak test was performed.  An intercostal nerve block was performed under direct visualization.  A mechanical pleurodesis was then performed.  A 28 F chest with then placed, and we watch the remaining lobes re-expand.  The skin and soft tissue were closed with absorbable suture.    The patient tolerated the procedure without any immediate complications, and was transferred to the PACU in stable condition.  Sahira Cataldi Keane Scrape

## 2020-09-08 NOTE — Progress Notes (Signed)
Pt arrived to 4E from PACU. Telemetry applied. CHG done. VSS. Pt oriented to room and call bell in reach.   Brooke Pace, RN

## 2020-09-08 NOTE — Anesthesia Postprocedure Evaluation (Signed)
Anesthesia Post Note  Patient: Tony Robinson  Procedure(s) Performed: VIDEO ASSISTED THORACOSCOPY (VATS) W/BLEB RESECTION , PLEUADESIS/ PLEURECTOMY (Right) INTERCOSTAL NERVE BLOCK     Patient location during evaluation: PACU Anesthesia Type: General Level of consciousness: awake and alert Pain management: pain level controlled Vital Signs Assessment: post-procedure vital signs reviewed and stable Respiratory status: spontaneous breathing, nonlabored ventilation, respiratory function stable and patient connected to nasal cannula oxygen Cardiovascular status: blood pressure returned to baseline and stable Postop Assessment: no apparent nausea or vomiting Anesthetic complications: no   No notable events documented.  Last Vitals:  Vitals:   09/08/20 1245 09/08/20 1316  BP: 117/82 120/80  Pulse: 80   Resp: 19 18  Temp: 36.4 C 36.7 C  SpO2: 94%     Last Pain:  Vitals:   09/08/20 1316  TempSrc: Oral  PainSc: 6                  Cecile Hearing

## 2020-09-08 NOTE — Anesthesia Preprocedure Evaluation (Addendum)
Anesthesia Evaluation  Patient identified by MRN, date of birth, ID band Patient awake    Reviewed: Allergy & Precautions, NPO status , Patient's Chart, lab work & pertinent test results  Airway Mallampati: II  TM Distance: >3 FB Neck ROM: Full    Dental  (+) Teeth Intact, Dental Advisory Given   Pulmonary Current Smoker and Patient abstained from smoking.,  S/P Spontaneous pneumothorax, continued increase in pneumothorax post chest tube removal    + decreased breath sounds      Cardiovascular negative cardio ROS Normal cardiovascular exam Rhythm:Regular Rate:Normal     Neuro/Psych negative neurological ROS  negative psych ROS   GI/Hepatic negative GI ROS, Neg liver ROS,   Endo/Other  negative endocrine ROS  Renal/GU negative Renal ROS     Musculoskeletal negative musculoskeletal ROS (+)   Abdominal   Peds  Hematology negative hematology ROS (+)   Anesthesia Other Findings Day of surgery medications reviewed with the patient.  Reproductive/Obstetrics                            Anesthesia Physical Anesthesia Plan  ASA: 2  Anesthesia Plan: General   Post-op Pain Management:    Induction: Intravenous  PONV Risk Score and Plan: 2 and Midazolam, Dexamethasone and Ondansetron  Airway Management Planned: Double Lumen EBT  Additional Equipment:   Intra-op Plan:   Post-operative Plan: Extubation in OR  Informed Consent: I have reviewed the patients History and Physical, chart, labs and discussed the procedure including the risks, benefits and alternatives for the proposed anesthesia with the patient or authorized representative who has indicated his/her understanding and acceptance.     Dental advisory given  Plan Discussed with: CRNA  Anesthesia Plan Comments: (Clear sight, 2nd large bore PIV)        Anesthesia Quick Evaluation

## 2020-09-08 NOTE — Brief Op Note (Signed)
09/02/2020 - 09/08/2020  10:44 AM  PATIENT:  Tony Robinson  45 y.o. male  PRE-OPERATIVE DIAGNOSIS: Persistent Pneumothorax, Bullous Emphysema  POST-OPERATIVE DIAGNOSIS:  Persistent Pneumothorax, Bullous Emphysema  PROCEDURE:   VIDEO ASSISTED THORACOSCOPY (VATS) W/BLEB RESECTION , PLEUADESIS/ PLEURECTOMY (Right) INTERCOSTAL NERVE BLOCK  SURGEON:  Corliss Skains, MD - Primary  PHYSICIAN ASSISTANT: Caitlinn Klinker  ASSISTANTS: Carola Frost, RN   ANESTHESIA:   general  EBL:  <90ml.  BLOOD ADMINISTERED:none  DRAINS:  Left pleural tube x 1    LOCAL MEDICATIONS USED:  Intercostal and local Exparel  SPECIMEN:  Source of Specimen:  Right apical pleura, right upper and middle lobe wedge   DISPOSITION OF SPECIMEN:  PATHOLOGY  COUNTS:  YES  DICTATION: .Dragon Dictation  PLAN OF CARE: Admit to inpatient   PATIENT DISPOSITION:  PACU - hemodynamically stable.   Delay start of Pharmacological VTE agent (>24hrs) due to surgical blood loss or risk of bleeding: yes

## 2020-09-08 NOTE — Anesthesia Procedure Notes (Signed)
Procedure Name: Intubation Date/Time: 09/08/2020 9:15 AM Performed by: Clearnce Sorrel, CRNA Pre-anesthesia Checklist: Patient identified, Emergency Drugs available, Suction available and Patient being monitored Patient Re-evaluated:Patient Re-evaluated prior to induction Oxygen Delivery Method: Circle System Utilized Preoxygenation: Pre-oxygenation with 100% oxygen Induction Type: IV induction Ventilation: Mask ventilation without difficulty Laryngoscope Size: Mac and 4 Grade View: Grade I Tube type: Oral Endobronchial tube: Left and 41 Fr Number of attempts: 1 Airway Equipment and Method: Stylet Placement Confirmation: ETT inserted through vocal cords under direct vision, positive ETCO2 and breath sounds checked- equal and bilateral Tube secured with: Tape Dental Injury: Teeth and Oropharynx as per pre-operative assessment

## 2020-09-08 NOTE — Transfer of Care (Signed)
Immediate Anesthesia Transfer of Care Note  Patient: Tony Robinson  Procedure(s) Performed: VIDEO ASSISTED THORACOSCOPY (VATS) W/BLEB RESECTION , PLEUADESIS/ PLEURECTOMY (Right) INTERCOSTAL NERVE BLOCK  Patient Location: PACU  Anesthesia Type:General  Level of Consciousness: awake, alert  and patient cooperative  Airway & Oxygen Therapy: Patient Spontanous Breathing  Post-op Assessment: Report given to RN and Post -op Vital signs reviewed and stable  Post vital signs: Reviewed and stable  Last Vitals:  Vitals Value Taken Time  BP 137/92 09/08/20 1115  Temp 36.7 C 09/08/20 1115  Pulse 91 09/08/20 1120  Resp 23 09/08/20 1120  SpO2 97 % 09/08/20 1120  Vitals shown include unvalidated device data.  Last Pain:  Vitals:   09/08/20 0838  TempSrc:   PainSc: 0-No pain         Complications: No notable events documented.

## 2020-09-09 ENCOUNTER — Encounter (HOSPITAL_COMMUNITY): Payer: Self-pay | Admitting: Thoracic Surgery (Cardiothoracic Vascular Surgery)

## 2020-09-09 ENCOUNTER — Inpatient Hospital Stay (HOSPITAL_COMMUNITY): Payer: 59

## 2020-09-09 LAB — BASIC METABOLIC PANEL
Anion gap: 9 (ref 5–15)
BUN: 21 mg/dL — ABNORMAL HIGH (ref 6–20)
CO2: 25 mmol/L (ref 22–32)
Calcium: 8.9 mg/dL (ref 8.9–10.3)
Chloride: 102 mmol/L (ref 98–111)
Creatinine, Ser: 1.14 mg/dL (ref 0.61–1.24)
GFR, Estimated: >60 mL/min (ref >60)
Glucose, Bld: 135 mg/dL — ABNORMAL HIGH (ref 70–99)
Potassium: 3.6 mmol/L (ref 3.5–5.1)
Sodium: 136 mmol/L (ref 135–145)

## 2020-09-09 LAB — CBC
HCT: 40.2 % (ref 39.0–52.0)
Hemoglobin: 13.8 g/dL (ref 13.0–17.0)
MCH: 31.4 pg (ref 26.0–34.0)
MCHC: 34.3 g/dL (ref 30.0–36.0)
MCV: 91.6 fL (ref 80.0–100.0)
Platelets: 236 10*3/uL (ref 150–400)
RBC: 4.39 MIL/uL (ref 4.22–5.81)
RDW: 12.8 % (ref 11.5–15.5)
WBC: 13.8 10*3/uL — ABNORMAL HIGH (ref 4.0–10.5)
nRBC: 0 % (ref 0.0–0.2)

## 2020-09-09 MED ORDER — OXYCODONE HCL 5 MG PO TABS
5.0000 mg | ORAL_TABLET | ORAL | Status: DC | PRN
Start: 2020-09-09 — End: 2020-09-11
  Administered 2020-09-09 – 2020-09-10 (×4): 5 mg via ORAL
  Filled 2020-09-09 (×4): qty 1

## 2020-09-09 NOTE — Progress Notes (Addendum)
      [address]       [place] [address]             [phone]      1 Day Post-Op Procedure(s) (LRB): VIDEO ASSISTED THORACOSCOPY (VATS) W/BLEB RESECTION , PLEUADESIS/ PLEURECTOMY (Right) INTERCOSTAL NERVE BLOCK Subjective: Patient with complaints of pain/pressure this morning.  He states he gets nerve pain off and on.  Also feels a good amount of pressure in his upper chest/shoulder region.  Gets relief with pain medication.  Denies N/V  Objective: Vital signs in last 24 hours: Temp:  [97.6 F (36.4 C)-98.4 F (36.9 C)] 98.4 F (36.9 C) (08/17 0737) Pulse Rate:  [60-103] 66 (08/17 0737) Cardiac Rhythm: Normal sinus rhythm (08/16 1950) Resp:  [15-28] 15 (08/17 0737) BP: (100-144)/(75-103) 117/85 (08/17 0737) SpO2:  [93 %-98 %] 97 % (08/17 0737)  Intake/Output from previous day: 08/16 0701 - 08/17 0700 In: 1590 [P.O.:540; I.V.:1050] Out: 385 [Urine:325; Blood:20; Chest Tube:40]  General appearance: alert, cooperative, and no distress Heart: regular rate and rhythm Lungs: clear to auscultation bilaterally Abdomen: soft, non-tender; bowel sounds normal; no masses,  no organomegaly Wound: clean and dry  Lab Results: Recent Labs    09/09/20 0115  WBC 13.8*  HGB 13.8  HCT 40.2  PLT 236   BMET:  Recent Labs    09/09/20 0115  NA 136  K 3.6  CL 102  CO2 25  GLUCOSE 135*  BUN 21*  CREATININE 1.14  CALCIUM 8.9    PT/INR: No results for input(s): LABPROT, INR in the last 72 hours. ABG No results found for: PHART, HCO3, TCO2, ACIDBASEDEF, O2SAT CBG (last 3)  No results for input(s): GLUCAP in the last 72 hours.  Assessment/Plan: S/P Procedure(s) (LRB): VIDEO ASSISTED THORACOSCOPY (VATS) W/BLEB RESECTION , PLEUADESIS/ PLEURECTOMY (Right) INTERCOSTAL NERVE BLOCK  CV- NSR,  mildly elevated BP, likely pain mediated will monitor Pulm- CT in place with small intermittent 1+ air leak, CXR with trace apical space/pneumothorax, leave chest tube  on suction  Lovenox for DVT prophylaxis Pain control- good relief with Toradol, continue prn medications as needed Dispo- patient stable, intermittent 1+ air leak, leave chest tube on suction, repeat CXR in AM   LOS: 6 days   Lowella Dandy, PA-C 09/09/2020   Agree with above. Chest tube to waterseal tomorrow.  Meily Glowacki Keane Scrape

## 2020-09-10 ENCOUNTER — Inpatient Hospital Stay (HOSPITAL_COMMUNITY): Payer: 59

## 2020-09-10 LAB — CBC
HCT: 38 % — ABNORMAL LOW (ref 39.0–52.0)
Hemoglobin: 13.1 g/dL (ref 13.0–17.0)
MCH: 31.7 pg (ref 26.0–34.0)
MCHC: 34.5 g/dL (ref 30.0–36.0)
MCV: 92 fL (ref 80.0–100.0)
Platelets: 217 10*3/uL (ref 150–400)
RBC: 4.13 MIL/uL — ABNORMAL LOW (ref 4.22–5.81)
RDW: 13.1 % (ref 11.5–15.5)
WBC: 9.4 10*3/uL (ref 4.0–10.5)
nRBC: 0 % (ref 0.0–0.2)

## 2020-09-10 LAB — COMPREHENSIVE METABOLIC PANEL
ALT: 18 U/L (ref 0–44)
AST: 16 U/L (ref 15–41)
Albumin: 3 g/dL — ABNORMAL LOW (ref 3.5–5.0)
Alkaline Phosphatase: 49 U/L (ref 38–126)
Anion gap: 8 (ref 5–15)
BUN: 20 mg/dL (ref 6–20)
CO2: 26 mmol/L (ref 22–32)
Calcium: 9.1 mg/dL (ref 8.9–10.3)
Chloride: 103 mmol/L (ref 98–111)
Creatinine, Ser: 1.13 mg/dL (ref 0.61–1.24)
GFR, Estimated: >60 mL/min (ref >60)
Glucose, Bld: 100 mg/dL — ABNORMAL HIGH (ref 70–99)
Potassium: 3.8 mmol/L (ref 3.5–5.1)
Sodium: 137 mmol/L (ref 135–145)
Total Bilirubin: 0.3 mg/dL (ref 0.3–1.2)
Total Protein: 5.7 g/dL — ABNORMAL LOW (ref 6.5–8.1)

## 2020-09-10 LAB — SURGICAL PATHOLOGY

## 2020-09-10 MED ORDER — PNEUMOCOCCAL VAC POLYVALENT 25 MCG/0.5ML IJ INJ
0.5000 mL | INJECTION | INTRAMUSCULAR | Status: DC
  Filled 2020-09-10: qty 0.5

## 2020-09-10 NOTE — Progress Notes (Addendum)
      [address]       [place] [address]             [phone]      2 Days Post-Op Procedure(s) (LRB): VIDEO ASSISTED THORACOSCOPY (VATS) W/BLEB RESECTION , PLEUADESIS/ PLEURECTOMY (Right) INTERCOSTAL NERVE BLOCK  Subjective: No new complaints.  Continues to have nerve pain.  He is ambulating.   + BM  Objective: Vital signs in last 24 hours: Temp:  [97.9 F (36.6 C)-98.6 F (37 C)] 97.9 F (36.6 C) (08/18 0433) Pulse Rate:  [59-76] 59 (08/18 0433) Cardiac Rhythm: Normal sinus rhythm (08/17 1955) Resp:  [14-16] 14 (08/18 0433) BP: (105-116)/(76-84) 105/79 (08/18 0433) SpO2:  [97 %-100 %] 97 % (08/18 0433)  Intake/Output from previous day: 08/17 0701 - 08/18 0700 In: 100 [P.O.:100] Out: 28 [Chest Tube:28]  General appearance: alert, cooperative, and no distress Heart: regular rate and rhythm Lungs: clear to auscultation bilaterally Abdomen: soft, non-tender; bowel sounds normal; no masses,  no organomegaly Wound: clean and dry  Lab Results: Recent Labs    09/09/20 0115 09/10/20 0244  WBC 13.8* 9.4  HGB 13.8 13.1  HCT 40.2 38.0*  PLT 236 217   BMET:  Recent Labs    09/09/20 0115 09/10/20 0244  NA 136 137  K 3.6 3.8  CL 102 103  CO2 25 26  GLUCOSE 135* 100*  BUN 21* 20  CREATININE 1.14 1.13  CALCIUM 8.9 9.1    PT/INR: No results for input(s): LABPROT, INR in the last 72 hours. ABG No results found for: PHART, HCO3, TCO2, ACIDBASEDEF, O2SAT CBG (last 3)  No results for input(s): GLUCAP in the last 72 hours.  Assessment/Plan: S/P Procedure(s) (LRB): VIDEO ASSISTED THORACOSCOPY (VATS) W/BLEB RESECTION , PLEUADESIS/ PLEURECTOMY (Right) INTERCOSTAL NERVE BLOCK  CV- NSR, BP improved Pulm- no acute issues, CXR with improvement of trace apical pneumothorax, leave chest tube to suction today Lovenox for DVT prophylaxis Dispo- patient stable, continue chest tube to suction today, improvement in right apical pneumothorax, repeat CXR  in AM  LOS: 7 days   Lowella Dandy, PA-C 09/10/2020  Agree with above. Chest tube to waterseal today.  Rana Adorno Keane Scrape

## 2020-09-11 ENCOUNTER — Inpatient Hospital Stay (HOSPITAL_COMMUNITY): Payer: 59

## 2020-09-11 DIAGNOSIS — Z9889 Other specified postprocedural states: Secondary | ICD-10-CM

## 2020-09-11 MED ORDER — OXYCODONE HCL 5 MG PO TABS: 5.0000 mg | ORAL_TABLET | Freq: Four times a day (QID) | ORAL | 0 refills | Status: DC | PRN

## 2020-09-11 NOTE — Progress Notes (Signed)
      [address]       [place] [address]             [phone]       Chest xray performed and the read shows:     CLINICAL DATA:  Follow-up pneumothorax on right. History of VATS procedure with talc pleurodesis.   EXAM: CHEST - 1 VIEW SAME DAY   COMPARISON:  09/11/2020 at 0513 hours   FINDINGS: Stable position of the right chest tube near the right lung apex. No change in the small right pneumothorax. Postsurgical changes near the right lung apex. Left lung remains clear. Heart size is normal. Trachea is midline.   IMPRESSION: No change in the small right pneumothorax.  Chest tube is stable.     Electronically Signed   By: Richarda Overlie M.D.   On: 09/11/2020 13:36  Plan: Okay to remove chest tube. CXR in the morning.

## 2020-09-11 NOTE — Progress Notes (Addendum)
      [address]       [place] [address]             [phone]      3 Days Post-Op Procedure(s) (LRB): VIDEO ASSISTED THORACOSCOPY (VATS) W/BLEB RESECTION , PLEUADESIS/ PLEURECTOMY (Right) INTERCOSTAL NERVE BLOCK  Subjective:  Patient slept better last night.  He has pain this morning.  Objective: Vital signs in last 24 hours: Temp:  [97.7 F (36.5 C)-98.3 F (36.8 C)] 98.3 F (36.8 C) (08/19 0404) Pulse Rate:  [67-80] 73 (08/19 0404) Cardiac Rhythm: Normal sinus rhythm (08/18 1929) Resp:  [13-18] 13 (08/19 0404) BP: (110-131)/(73-86) 111/73 (08/19 0404) SpO2:  [95 %-100 %] 95 % (08/19 0404)  Intake/Output from previous day: 08/18 0701 - 08/19 0700 In: 486 [P.O.:486] Out: 80 [Chest Tube:80]  General appearance: alert, cooperative, and no distress Heart: regular rate and rhythm Lungs: clear to auscultation bilaterally Abdomen: soft, non-tender; bowel sounds normal; no masses,  no organomegaly Extremities: extremities normal, atraumatic, no cyanosis or edema Wound: clean and dry  Lab Results: Recent Labs    09/09/20 0115 09/10/20 0244  WBC 13.8* 9.4  HGB 13.8 13.1  HCT 40.2 38.0*  PLT 236 217   BMET:  Recent Labs    09/09/20 0115 09/10/20 0244  NA 136 137  K 3.6 3.8  CL 102 103  CO2 25 26  GLUCOSE 135* 100*  BUN 21* 20  CREATININE 1.14 1.13  CALCIUM 8.9 9.1    PT/INR: No results for input(s): LABPROT, INR in the last 72 hours. ABG No results found for: PHART, HCO3, TCO2, ACIDBASEDEF, O2SAT CBG (last 3)  No results for input(s): GLUCAP in the last 72 hours.  Assessment/Plan: S/P Procedure(s) (LRB): VIDEO ASSISTED THORACOSCOPY (VATS) W/BLEB RESECTION , PLEUADESIS/ PLEURECTOMY (Right) INTERCOSTAL NERVE BLOCK  CV- NSR, BP stable Pulm- air leaking from around chest tube, when pressure placed, no air leak present... potentially clamp chest tube today, will discuss with Dr. Cliffton Asters Lovenox for DVT prophylaxis Dispo- patient,  no air leak present when pressure applied around chest tube, possibly clamp chest tube today will discuss with [place]  Addendum:  As discussed with Dr. Cliffton Asters, will clamp chest tube, repeat CXR at 12:00   LOS: 8 days   Lowella Dandy, PA-C  09/11/2020

## 2020-09-15 ENCOUNTER — Ambulatory Visit: Payer: 59 | Admitting: Thoracic Surgery (Cardiothoracic Vascular Surgery)

## 2020-09-17 ENCOUNTER — Other Ambulatory Visit: Payer: Self-pay | Admitting: Thoracic Surgery (Cardiothoracic Vascular Surgery)

## 2020-09-17 DIAGNOSIS — J9383 Other pneumothorax: Secondary | ICD-10-CM

## 2020-09-18 ENCOUNTER — Ambulatory Visit
Admission: RE | Admit: 2020-09-18 | Discharge: 2020-09-18 | Disposition: A | Discharge status: Home / Self Care | Payer: 59 | Source: Ambulatory Visit | Attending: Thoracic Surgery (Cardiothoracic Vascular Surgery) | Admitting: Thoracic Surgery (Cardiothoracic Vascular Surgery)

## 2020-09-18 ENCOUNTER — Other Ambulatory Visit: Payer: Self-pay

## 2020-09-18 ENCOUNTER — Ambulatory Visit (INDEPENDENT_AMBULATORY_CARE_PROVIDER_SITE_OTHER): Payer: Self-pay | Admitting: Thoracic Surgery (Cardiothoracic Vascular Surgery)

## 2020-09-18 VITALS — BP 130/82 | HR 68 | Resp 20 | Ht 74.0 in | Wt 175.0 lb

## 2020-09-18 DIAGNOSIS — J9383 Other pneumothorax: Secondary | ICD-10-CM

## 2020-09-18 DIAGNOSIS — Z09 Encounter for follow-up examination after completed treatment for conditions other than malignant neoplasm: Secondary | ICD-10-CM

## 2020-09-18 MED ORDER — OXYCODONE HCL 5 MG PO TABS: 10.0000 mg | ORAL_TABLET | Freq: Four times a day (QID) | ORAL | 0 refills | Status: DC | PRN

## 2020-09-18 NOTE — Progress Notes (Signed)
      [address]       [place] [address]             [phone]        MARV ALFREY [facility] Medical Record #[id number] Date of Birth: 07-04-1975  Referring: Lorre Nick, MD Primary Care: Patient, No Pcp Per (Inactive) Primary Cardiologist:None  Reason for visit:   follow-up  History of Present Illness:     Patient comes in for his 1 week follow-up appointment.  He denies any shortness of breath but does complain of worsening chest wall pain.  Physical Exam: BP 130/82   Pulse 68   Resp 20   Ht 6\' 2"  (1.88 m)   Wt 175 lb (79.4 kg)   SpO2 98% Comment: RA  BMI 22.47 kg/m   Alert NAD Incision clean, stitch removed.   Abdomen soft, ND No peripheral edema   Diagnostic Studies & Laboratory data: CXR: Small apical pneumothorax unchanged. Path: Negative for malignancy.    Assessment / Plan:   45 year old male status post robotic assisted wedge resection, apical pleurectomy, mechanical releases for spontaneous pneumothorax in setting of bullous disease instructed him to go a Tylenol/ibuprofen regimen followed by oxycodone.  I have increased his dose of oxycodone from 5 mg to 10.  I will see him back in 1 month with a chest x-ray.   59 09/18/2020 4:52 PM

## 2020-10-21 ENCOUNTER — Other Ambulatory Visit: Payer: Self-pay | Admitting: Thoracic Surgery (Cardiothoracic Vascular Surgery)

## 2020-10-21 DIAGNOSIS — J9383 Other pneumothorax: Secondary | ICD-10-CM

## 2020-10-23 ENCOUNTER — Ambulatory Visit: Payer: 59 | Admitting: Thoracic Surgery (Cardiothoracic Vascular Surgery)

## 2020-10-23 ENCOUNTER — Ambulatory Visit (INDEPENDENT_AMBULATORY_CARE_PROVIDER_SITE_OTHER): Payer: Self-pay | Admitting: Thoracic Surgery (Cardiothoracic Vascular Surgery)

## 2020-10-23 ENCOUNTER — Other Ambulatory Visit: Payer: Self-pay

## 2020-10-23 ENCOUNTER — Ambulatory Visit
Admission: RE | Admit: 2020-10-23 | Discharge: 2020-10-23 | Disposition: A | Discharge status: Home / Self Care | Payer: 59 | Source: Ambulatory Visit | Attending: Thoracic Surgery (Cardiothoracic Vascular Surgery) | Admitting: Thoracic Surgery (Cardiothoracic Vascular Surgery)

## 2020-10-23 VITALS — BP 123/90 | HR 66 | Resp 18 | Ht 74.0 in | Wt 180.0 lb

## 2020-10-23 DIAGNOSIS — Z9889 Other specified postprocedural states: Secondary | ICD-10-CM

## 2020-10-23 DIAGNOSIS — J9383 Other pneumothorax: Secondary | ICD-10-CM

## 2020-10-23 NOTE — Progress Notes (Signed)
      [address]       [place] [address]             [phone]        Tony Robinson [facility] Medical Record #[id number] Date of Birth: 10/23/1975  Referring: Lorre Nick, MD Primary Care: Patient, No Pcp Per (Inactive) Primary Cardiologist:None  Reason for visit:   follow-up  History of Present Illness:     45 year old male presents for his 1 month follow-up.  He underwent a robotic assisted thoracoscopy with wedge resection, apical pleurectomy and mechanical pleurodesis.  He continues to complain of some chest wall pain and paresthesias.  From a respiratory standpoint he is doing well.  Physical Exam: BP 123/90 (BP Location: Right Arm, Patient Position: Sitting)   Pulse 66   Resp 18   Ht 6\' 2"  (1.88 m)   Wt 180 lb (81.6 kg)   SpO2 98% Comment: RA  BMI 23.11 kg/m   Alert NAD Incision clean, well-healed.    No peripheral edema   Diagnostic Studies & Laboratory data: CXR: Clear     Assessment / Plan:   45 year old male status post wedge resection, apical pleurectomy, mechanical pleurodesis for spontaneous pneumo.  Chest x-ray shows well-expanded lungs.  Overall he is doing well.  He will follow-up as needed.   59 10/23/2020 7:01 PM

## 2021-08-26 ENCOUNTER — Other Ambulatory Visit: Payer: Self-pay

## 2021-08-26 ENCOUNTER — Encounter (HOSPITAL_COMMUNITY): Payer: Self-pay

## 2021-08-26 ENCOUNTER — Emergency Department (HOSPITAL_COMMUNITY)
Admission: EM | Admit: 2021-08-26 | Discharge: 2021-08-26 | Disposition: A | Discharge status: Home / Self Care | Payer: 59 | Attending: Emergency Medicine | Admitting: Emergency Medicine

## 2021-08-26 DIAGNOSIS — M549 Dorsalgia, unspecified: Secondary | ICD-10-CM | POA: Diagnosis not present

## 2021-08-26 DIAGNOSIS — B029 Zoster without complications: Secondary | ICD-10-CM | POA: Insufficient documentation

## 2021-08-26 DIAGNOSIS — R109 Unspecified abdominal pain: Secondary | ICD-10-CM | POA: Diagnosis present

## 2021-08-26 MED ORDER — OXYCODONE-ACETAMINOPHEN 5-325 MG PO TABS
2.0000 | ORAL_TABLET | Freq: Once | ORAL | Status: AC
  Administered 2021-08-26: 2 via ORAL
  Filled 2021-08-26: qty 2

## 2021-08-26 MED ORDER — VALACYCLOVIR HCL 1 G PO TABS: 1000.0000 mg | ORAL_TABLET | Freq: Three times a day (TID) | ORAL | 0 refills | Status: AC

## 2021-08-26 MED ORDER — OXYCODONE-ACETAMINOPHEN 5-325 MG PO TABS: 1.0000 | ORAL_TABLET | Freq: Four times a day (QID) | ORAL | 0 refills | Status: AC | PRN

## 2021-08-26 MED ORDER — PREDNISONE 20 MG PO TABS: 40.0000 mg | ORAL_TABLET | Freq: Every day | ORAL | 0 refills | Status: AC

## 2021-08-26 MED ORDER — VALACYCLOVIR HCL 500 MG PO TABS
1000.0000 mg | ORAL_TABLET | Freq: Once | ORAL | Status: AC
  Administered 2021-08-26: 1000 mg via ORAL
  Filled 2021-08-26: qty 2

## 2021-08-26 NOTE — ED Triage Notes (Signed)
Rash on right abdomen x 4 days.

## 2021-08-26 NOTE — ED Provider Notes (Signed)
[facility] Emergency Department Provider Note MRN:  [id number]  Arrival date & time: 08/26/21     Chief Complaint   Abdominal pain and back pain   History of Present Illness   Tony Robinson is a 46 y.o. year-old male presents to the ED with chief complaint of abdominal pain and back pain.  He states that he has had the symptoms for the past 2 days.  He states that symptoms are gradually worsening.  He denies any fever, chills, nausea, or vomiting.  States that he noticed a rash that developed overlying his liver this morning.Marland Kitchen  History provided by patient.   Review of Systems  Pertinent review of systems noted in HPI.    Physical Exam   Vitals:   08/26/21 0233  BP: (!) 120/102  Pulse: 85  Resp: 15  Temp: 97.9 F (36.6 C)  SpO2: 98%    CONSTITUTIONAL:  well-appearing, NAD NEURO:  Alert and oriented x 3, CN 3-12 grossly intact EYES:  eyes equal and reactive ENT/NECK:  Supple, no stridor  CARDIO:  appears well-perfused  PULM:  No respiratory distress,  GI/GU:  non-distended,  MSK/SPINE:  No gross deformities, no edema, moves all extremities  SKIN: Erythematous vesicular lesions developing on abdomen and wrapping around onto back consistent with shingles      *Additional and/or pertinent findings included in MDM below  Diagnostic and Interventional Summary    EKG Interpretation  Date/Time:    Ventricular Rate:    PR Interval:    QRS Duration:   QT Interval:    QTC Calculation:   R Axis:     Text Interpretation:         Labs Reviewed - No data to display  No orders to display    Medications  oxyCODONE-acetaminophen (PERCOCET/ROXICET) 5-325 MG per tablet 2 tablet (has no administration in time range)  valACYclovir (VALTREX) tablet 1,000 mg (has no administration in time range)     Procedures  /  Critical Care Procedures  ED Course and Medical Decision Making  I have reviewed the triage vital signs, the nursing notes, and  pertinent available records from the EMR.  Social Determinants Affecting Complexity of Care: Patient has no clinically significant social determinants affecting this chief complaint..   ED Course:    Medical Decision Making Patient here with shingles.  He has been complaining of abdominal pain and back pain for the past 2 days.  Noticed a rash that developed today.  The rash is consistent with shingles.  Patient asked about any additional work-up needed.  At this time, I do not feel that additional work-up is indicated.  I did offer abdominal labs based on the abdominal pain, but patient is agreeable with discharge and outpatient follow-up.  Will return if symptoms worsen.  I will start patient on Valtrex, given pain medicine, and prednisone.  Problems Addressed: Herpes zoster without complication: acute illness or injury  Risk Prescription drug management.     Consultants: No consultations were needed in caring for this patient.   Treatment and Plan: Emergency department workup does not suggest an emergent condition requiring admission or immediate intervention beyond  what has been performed at this time. The patient is safe for discharge and has  been instructed to return immediately for worsening symptoms, change in  symptoms or any other concerns    Final Clinical Impressions(s) / ED Diagnoses     ICD-10-CM   1. Herpes zoster without complication  B02.9  ED Discharge Orders          Ordered    valACYclovir (VALTREX) 1000 MG tablet  3 times daily        08/26/21 0244    predniSONE (DELTASONE) 20 MG tablet  Daily        08/26/21 0244    oxyCODONE-acetaminophen (PERCOCET) 5-325 MG tablet  Every 6 hours PRN        08/26/21 0244              Discharge Instructions Discussed with and Provided to Patient:   Discharge Instructions   None      Roxy Horseman, PA-C 08/26/21 0246    Gilda Crease, MD 08/26/21 [phone]

## 2022-03-15 IMAGING — CR DG CHEST 2V
2 series · 2 of 2 positions shown · non-contrast
Comparison: Chest x-ray 09/18/2020.

CLINICAL DATA: 45-year-old male with history of spontaneous
pneumothorax status post VATS procedure on 09/08/2020. Follow-up
study.

EXAM:
CHEST - 2 VIEW

[w chest pa]
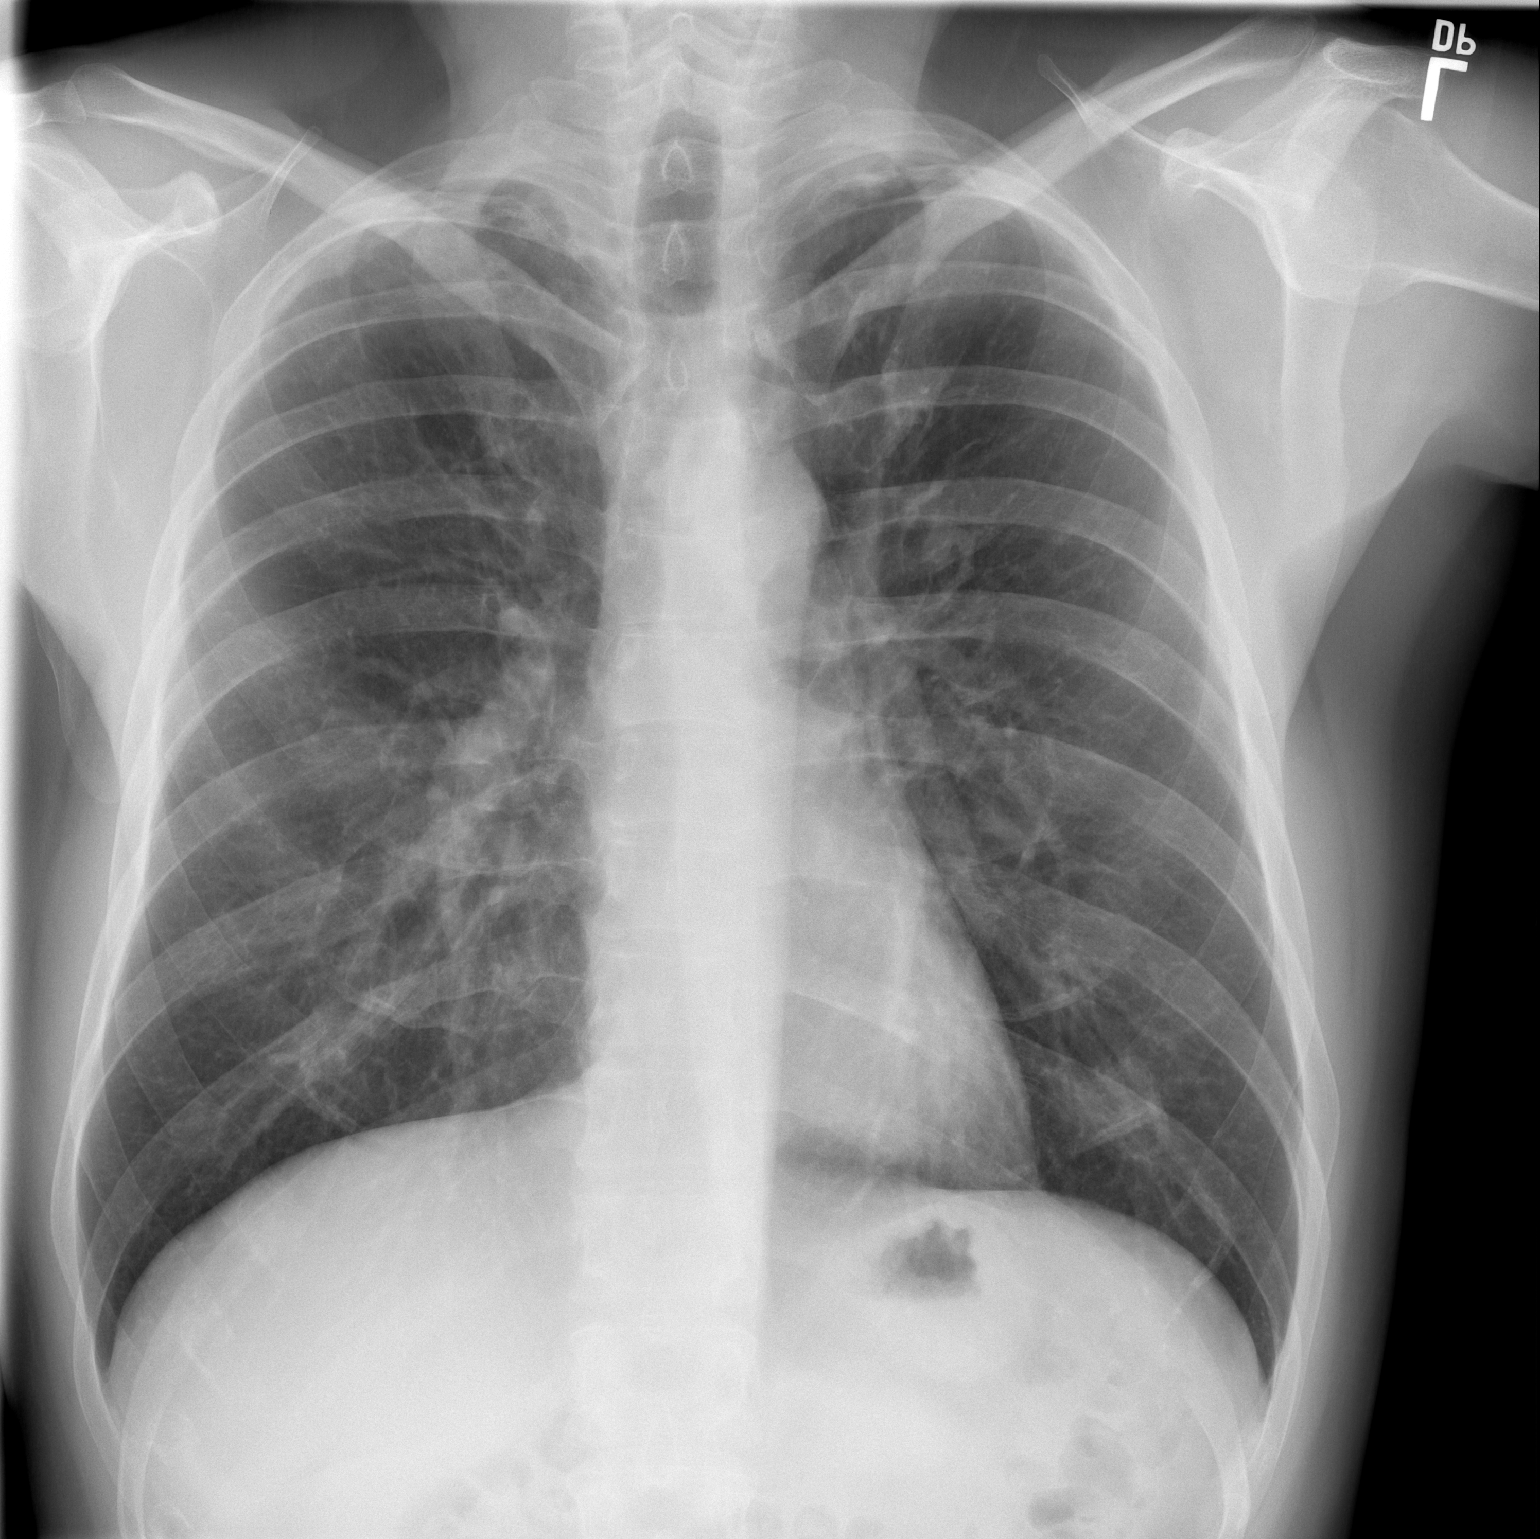

[w chest lat]
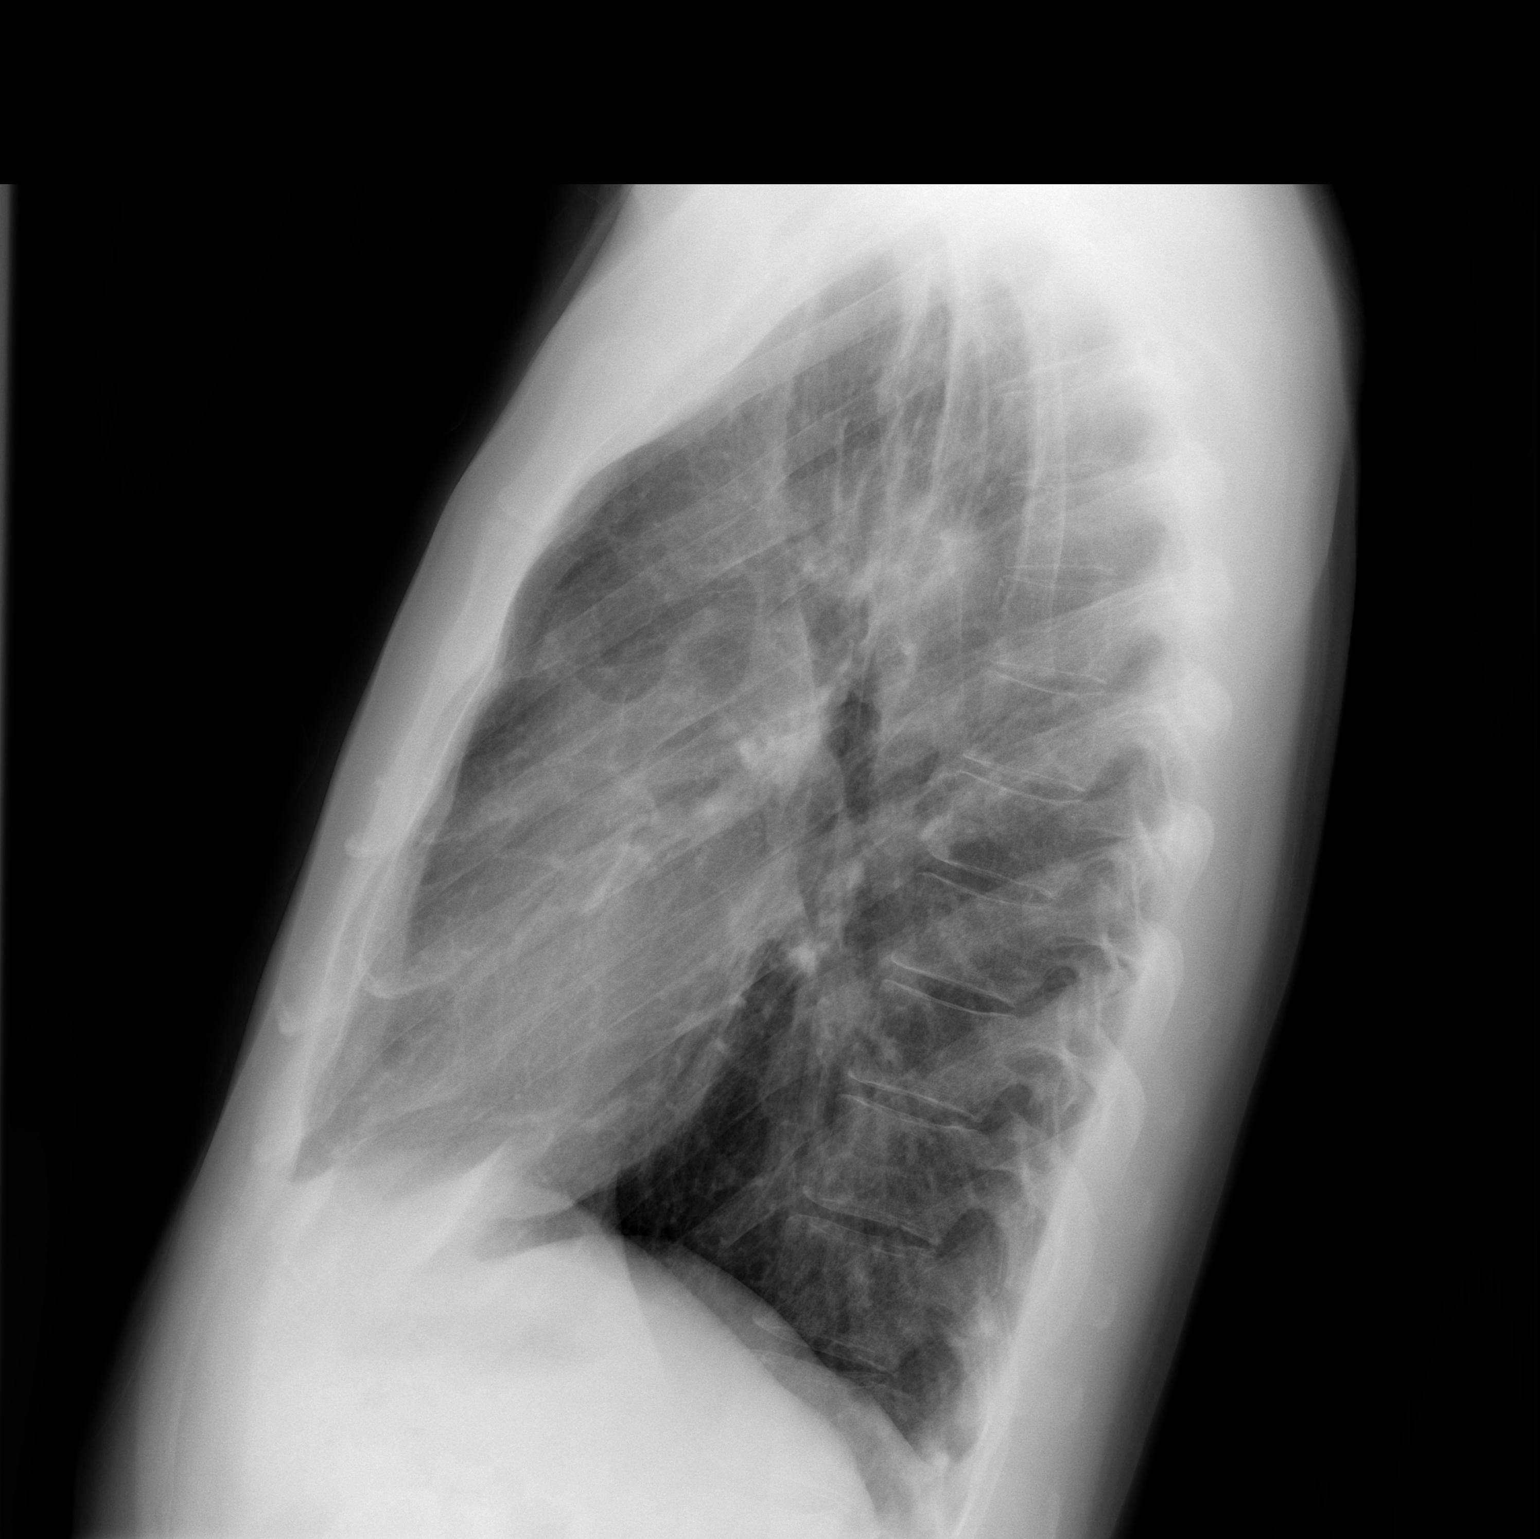

[2 of 2 positions shown; findings below may reference images not displayed]

FINDINGS: Lung volumes are normal. No consolidative airspace disease. No
pleural effusions. No pneumothorax. Mild bilateral apical
pleuroparenchymal thickening and architectural distortion, most
compatible with areas of chronic post infectious or inflammatory
scarring. No pulmonary nodule or mass noted. Pulmonary vasculature
and the cardiomediastinal silhouette are within normal limits.
IMPRESSION: No radiographic evidence of acute cardiopulmonary disease.
